# Patient Record
Sex: Female | Born: 1935 | Race: Black or African American | Hispanic: No | Smoking: Former smoker
Health system: Southern US, Community
[De-identification: ages and names within clinical notes are randomized; demographics above are authoritative.]

## PROBLEM LIST (undated history)

## (undated) DIAGNOSIS — R911 Solitary pulmonary nodule: Secondary | ICD-10-CM

## (undated) DIAGNOSIS — G4733 Obstructive sleep apnea (adult) (pediatric): Secondary | ICD-10-CM

## (undated) DIAGNOSIS — E785 Hyperlipidemia, unspecified: Secondary | ICD-10-CM

## (undated) DIAGNOSIS — J449 Chronic obstructive pulmonary disease, unspecified: Secondary | ICD-10-CM

## (undated) DIAGNOSIS — E119 Type 2 diabetes mellitus without complications: Secondary | ICD-10-CM

## (undated) DIAGNOSIS — E049 Nontoxic goiter, unspecified: Secondary | ICD-10-CM

## (undated) DIAGNOSIS — I10 Essential (primary) hypertension: Secondary | ICD-10-CM

## (undated) HISTORY — PX: ABDOMINAL HYSTERECTOMY: SHX81

## (undated) HISTORY — PX: COLON SURGERY: SHX602

---

## 2005-07-15 ENCOUNTER — Ambulatory Visit (HOSPITAL_COMMUNITY): Admission: RE | Admit: 2005-07-15 | Discharge: 2005-07-15 | Payer: Self-pay | Admitting: Specialist

## 2011-02-25 DIAGNOSIS — J449 Chronic obstructive pulmonary disease, unspecified: Secondary | ICD-10-CM | POA: Diagnosis not present

## 2011-02-25 DIAGNOSIS — J984 Other disorders of lung: Secondary | ICD-10-CM | POA: Diagnosis not present

## 2011-02-25 DIAGNOSIS — G4730 Sleep apnea, unspecified: Secondary | ICD-10-CM | POA: Diagnosis not present

## 2011-02-25 DIAGNOSIS — G4710 Hypersomnia, unspecified: Secondary | ICD-10-CM | POA: Diagnosis not present

## 2011-03-08 DIAGNOSIS — H409 Unspecified glaucoma: Secondary | ICD-10-CM | POA: Diagnosis not present

## 2011-03-08 DIAGNOSIS — H4011X0 Primary open-angle glaucoma, stage unspecified: Secondary | ICD-10-CM | POA: Diagnosis not present

## 2011-03-30 DIAGNOSIS — R0982 Postnasal drip: Secondary | ICD-10-CM | POA: Diagnosis not present

## 2011-03-30 DIAGNOSIS — G4730 Sleep apnea, unspecified: Secondary | ICD-10-CM | POA: Diagnosis not present

## 2011-03-30 DIAGNOSIS — J449 Chronic obstructive pulmonary disease, unspecified: Secondary | ICD-10-CM | POA: Diagnosis not present

## 2011-03-30 DIAGNOSIS — J984 Other disorders of lung: Secondary | ICD-10-CM | POA: Diagnosis not present

## 2011-03-31 DIAGNOSIS — J984 Other disorders of lung: Secondary | ICD-10-CM | POA: Diagnosis not present

## 2011-03-31 DIAGNOSIS — G4710 Hypersomnia, unspecified: Secondary | ICD-10-CM | POA: Diagnosis not present

## 2011-03-31 DIAGNOSIS — J449 Chronic obstructive pulmonary disease, unspecified: Secondary | ICD-10-CM | POA: Diagnosis not present

## 2011-03-31 DIAGNOSIS — R0982 Postnasal drip: Secondary | ICD-10-CM | POA: Diagnosis not present

## 2011-03-31 DIAGNOSIS — G4730 Sleep apnea, unspecified: Secondary | ICD-10-CM | POA: Diagnosis not present

## 2011-07-06 DIAGNOSIS — J449 Chronic obstructive pulmonary disease, unspecified: Secondary | ICD-10-CM | POA: Diagnosis not present

## 2011-07-06 DIAGNOSIS — G4730 Sleep apnea, unspecified: Secondary | ICD-10-CM | POA: Diagnosis not present

## 2011-07-06 DIAGNOSIS — G4710 Hypersomnia, unspecified: Secondary | ICD-10-CM | POA: Diagnosis not present

## 2011-07-06 DIAGNOSIS — R0982 Postnasal drip: Secondary | ICD-10-CM | POA: Diagnosis not present

## 2011-07-06 DIAGNOSIS — J984 Other disorders of lung: Secondary | ICD-10-CM | POA: Diagnosis not present

## 2011-08-09 DIAGNOSIS — J449 Chronic obstructive pulmonary disease, unspecified: Secondary | ICD-10-CM | POA: Diagnosis not present

## 2011-08-09 DIAGNOSIS — J984 Other disorders of lung: Secondary | ICD-10-CM | POA: Diagnosis not present

## 2011-08-09 DIAGNOSIS — G4730 Sleep apnea, unspecified: Secondary | ICD-10-CM | POA: Diagnosis not present

## 2011-08-09 DIAGNOSIS — R0982 Postnasal drip: Secondary | ICD-10-CM | POA: Diagnosis not present

## 2011-08-10 DIAGNOSIS — J984 Other disorders of lung: Secondary | ICD-10-CM | POA: Diagnosis not present

## 2011-08-10 DIAGNOSIS — J449 Chronic obstructive pulmonary disease, unspecified: Secondary | ICD-10-CM | POA: Diagnosis not present

## 2011-08-10 DIAGNOSIS — G4710 Hypersomnia, unspecified: Secondary | ICD-10-CM | POA: Diagnosis not present

## 2011-08-10 DIAGNOSIS — R0982 Postnasal drip: Secondary | ICD-10-CM | POA: Diagnosis not present

## 2011-09-13 DIAGNOSIS — H43819 Vitreous degeneration, unspecified eye: Secondary | ICD-10-CM | POA: Diagnosis not present

## 2011-09-13 DIAGNOSIS — H409 Unspecified glaucoma: Secondary | ICD-10-CM | POA: Diagnosis not present

## 2011-09-13 DIAGNOSIS — H4011X0 Primary open-angle glaucoma, stage unspecified: Secondary | ICD-10-CM | POA: Diagnosis not present

## 2011-09-13 DIAGNOSIS — H2510 Age-related nuclear cataract, unspecified eye: Secondary | ICD-10-CM | POA: Diagnosis not present

## 2011-10-05 DIAGNOSIS — R911 Solitary pulmonary nodule: Secondary | ICD-10-CM | POA: Diagnosis not present

## 2011-10-13 DIAGNOSIS — E041 Nontoxic single thyroid nodule: Secondary | ICD-10-CM | POA: Diagnosis not present

## 2011-11-17 DIAGNOSIS — G4710 Hypersomnia, unspecified: Secondary | ICD-10-CM | POA: Diagnosis not present

## 2011-11-17 DIAGNOSIS — J449 Chronic obstructive pulmonary disease, unspecified: Secondary | ICD-10-CM | POA: Diagnosis not present

## 2011-11-17 DIAGNOSIS — G4730 Sleep apnea, unspecified: Secondary | ICD-10-CM | POA: Diagnosis not present

## 2011-11-17 DIAGNOSIS — Z23 Encounter for immunization: Secondary | ICD-10-CM | POA: Diagnosis not present

## 2011-11-17 DIAGNOSIS — J984 Other disorders of lung: Secondary | ICD-10-CM | POA: Diagnosis not present

## 2011-11-18 DIAGNOSIS — G4710 Hypersomnia, unspecified: Secondary | ICD-10-CM | POA: Diagnosis not present

## 2011-11-18 DIAGNOSIS — G4730 Sleep apnea, unspecified: Secondary | ICD-10-CM | POA: Diagnosis not present

## 2011-11-23 DIAGNOSIS — E119 Type 2 diabetes mellitus without complications: Secondary | ICD-10-CM | POA: Diagnosis not present

## 2011-11-23 DIAGNOSIS — E041 Nontoxic single thyroid nodule: Secondary | ICD-10-CM | POA: Diagnosis not present

## 2011-11-23 DIAGNOSIS — R1310 Dysphagia, unspecified: Secondary | ICD-10-CM | POA: Diagnosis not present

## 2011-11-25 DIAGNOSIS — J449 Chronic obstructive pulmonary disease, unspecified: Secondary | ICD-10-CM | POA: Diagnosis not present

## 2012-01-27 DIAGNOSIS — E119 Type 2 diabetes mellitus without complications: Secondary | ICD-10-CM | POA: Diagnosis not present

## 2012-01-27 DIAGNOSIS — Z79899 Other long term (current) drug therapy: Secondary | ICD-10-CM | POA: Diagnosis not present

## 2012-01-27 DIAGNOSIS — E049 Nontoxic goiter, unspecified: Secondary | ICD-10-CM | POA: Diagnosis not present

## 2012-01-27 DIAGNOSIS — E7800 Pure hypercholesterolemia, unspecified: Secondary | ICD-10-CM | POA: Diagnosis not present

## 2012-02-03 DIAGNOSIS — Z1231 Encounter for screening mammogram for malignant neoplasm of breast: Secondary | ICD-10-CM | POA: Diagnosis not present

## 2012-03-13 DIAGNOSIS — J449 Chronic obstructive pulmonary disease, unspecified: Secondary | ICD-10-CM | POA: Diagnosis not present

## 2012-03-13 DIAGNOSIS — R0982 Postnasal drip: Secondary | ICD-10-CM | POA: Diagnosis not present

## 2012-03-13 DIAGNOSIS — G4710 Hypersomnia, unspecified: Secondary | ICD-10-CM | POA: Diagnosis not present

## 2012-03-13 DIAGNOSIS — J984 Other disorders of lung: Secondary | ICD-10-CM | POA: Diagnosis not present

## 2012-04-24 DIAGNOSIS — H4011X0 Primary open-angle glaucoma, stage unspecified: Secondary | ICD-10-CM | POA: Diagnosis not present

## 2012-04-24 DIAGNOSIS — H2510 Age-related nuclear cataract, unspecified eye: Secondary | ICD-10-CM | POA: Diagnosis not present

## 2012-04-24 DIAGNOSIS — H43819 Vitreous degeneration, unspecified eye: Secondary | ICD-10-CM | POA: Diagnosis not present

## 2012-06-06 DIAGNOSIS — R609 Edema, unspecified: Secondary | ICD-10-CM | POA: Diagnosis not present

## 2012-06-06 DIAGNOSIS — E119 Type 2 diabetes mellitus without complications: Secondary | ICD-10-CM | POA: Diagnosis not present

## 2012-06-06 DIAGNOSIS — Z79899 Other long term (current) drug therapy: Secondary | ICD-10-CM | POA: Diagnosis not present

## 2012-06-06 DIAGNOSIS — E669 Obesity, unspecified: Secondary | ICD-10-CM | POA: Diagnosis not present

## 2012-06-06 DIAGNOSIS — E7800 Pure hypercholesterolemia, unspecified: Secondary | ICD-10-CM | POA: Diagnosis not present

## 2012-06-20 DIAGNOSIS — J984 Other disorders of lung: Secondary | ICD-10-CM | POA: Diagnosis not present

## 2012-06-20 DIAGNOSIS — G4730 Sleep apnea, unspecified: Secondary | ICD-10-CM | POA: Diagnosis not present

## 2012-06-20 DIAGNOSIS — R0982 Postnasal drip: Secondary | ICD-10-CM | POA: Diagnosis not present

## 2012-06-20 DIAGNOSIS — J449 Chronic obstructive pulmonary disease, unspecified: Secondary | ICD-10-CM | POA: Diagnosis not present

## 2012-06-20 DIAGNOSIS — G4710 Hypersomnia, unspecified: Secondary | ICD-10-CM | POA: Diagnosis not present

## 2012-06-21 DIAGNOSIS — G4710 Hypersomnia, unspecified: Secondary | ICD-10-CM | POA: Diagnosis not present

## 2012-06-21 DIAGNOSIS — G4730 Sleep apnea, unspecified: Secondary | ICD-10-CM | POA: Diagnosis not present

## 2012-10-25 DIAGNOSIS — E049 Nontoxic goiter, unspecified: Secondary | ICD-10-CM | POA: Diagnosis not present

## 2012-10-25 DIAGNOSIS — J449 Chronic obstructive pulmonary disease, unspecified: Secondary | ICD-10-CM | POA: Diagnosis not present

## 2012-10-25 DIAGNOSIS — J438 Other emphysema: Secondary | ICD-10-CM | POA: Diagnosis not present

## 2012-11-09 DIAGNOSIS — R0982 Postnasal drip: Secondary | ICD-10-CM | POA: Diagnosis not present

## 2012-11-09 DIAGNOSIS — J449 Chronic obstructive pulmonary disease, unspecified: Secondary | ICD-10-CM | POA: Diagnosis not present

## 2012-11-09 DIAGNOSIS — R918 Other nonspecific abnormal finding of lung field: Secondary | ICD-10-CM | POA: Diagnosis not present

## 2012-11-09 DIAGNOSIS — G4710 Hypersomnia, unspecified: Secondary | ICD-10-CM | POA: Diagnosis not present

## 2012-11-09 DIAGNOSIS — Z23 Encounter for immunization: Secondary | ICD-10-CM | POA: Diagnosis not present

## 2012-11-10 DIAGNOSIS — G4710 Hypersomnia, unspecified: Secondary | ICD-10-CM | POA: Diagnosis not present

## 2012-12-06 DIAGNOSIS — M542 Cervicalgia: Secondary | ICD-10-CM | POA: Diagnosis not present

## 2012-12-06 DIAGNOSIS — E1159 Type 2 diabetes mellitus with other circulatory complications: Secondary | ICD-10-CM | POA: Diagnosis not present

## 2012-12-06 DIAGNOSIS — K219 Gastro-esophageal reflux disease without esophagitis: Secondary | ICD-10-CM | POA: Diagnosis not present

## 2012-12-06 DIAGNOSIS — E7800 Pure hypercholesterolemia, unspecified: Secondary | ICD-10-CM | POA: Diagnosis not present

## 2012-12-06 DIAGNOSIS — R079 Chest pain, unspecified: Secondary | ICD-10-CM | POA: Diagnosis not present

## 2012-12-06 DIAGNOSIS — E785 Hyperlipidemia, unspecified: Secondary | ICD-10-CM | POA: Diagnosis not present

## 2012-12-06 DIAGNOSIS — I10 Essential (primary) hypertension: Secondary | ICD-10-CM | POA: Diagnosis not present

## 2012-12-06 DIAGNOSIS — D72829 Elevated white blood cell count, unspecified: Secondary | ICD-10-CM | POA: Diagnosis not present

## 2012-12-06 DIAGNOSIS — R072 Precordial pain: Secondary | ICD-10-CM | POA: Diagnosis not present

## 2012-12-06 DIAGNOSIS — J449 Chronic obstructive pulmonary disease, unspecified: Secondary | ICD-10-CM | POA: Diagnosis not present

## 2012-12-06 DIAGNOSIS — Z79899 Other long term (current) drug therapy: Secondary | ICD-10-CM | POA: Diagnosis not present

## 2012-12-06 DIAGNOSIS — E119 Type 2 diabetes mellitus without complications: Secondary | ICD-10-CM | POA: Diagnosis not present

## 2012-12-06 DIAGNOSIS — J4489 Other specified chronic obstructive pulmonary disease: Secondary | ICD-10-CM | POA: Diagnosis not present

## 2012-12-07 DIAGNOSIS — R0789 Other chest pain: Secondary | ICD-10-CM | POA: Diagnosis not present

## 2012-12-07 DIAGNOSIS — E785 Hyperlipidemia, unspecified: Secondary | ICD-10-CM | POA: Diagnosis not present

## 2012-12-07 DIAGNOSIS — R079 Chest pain, unspecified: Secondary | ICD-10-CM | POA: Diagnosis not present

## 2012-12-07 DIAGNOSIS — R9431 Abnormal electrocardiogram [ECG] [EKG]: Secondary | ICD-10-CM | POA: Diagnosis not present

## 2012-12-07 DIAGNOSIS — I10 Essential (primary) hypertension: Secondary | ICD-10-CM | POA: Diagnosis not present

## 2012-12-07 DIAGNOSIS — E1159 Type 2 diabetes mellitus with other circulatory complications: Secondary | ICD-10-CM | POA: Diagnosis not present

## 2012-12-11 DIAGNOSIS — E0590 Thyrotoxicosis, unspecified without thyrotoxic crisis or storm: Secondary | ICD-10-CM | POA: Diagnosis not present

## 2012-12-11 DIAGNOSIS — I10 Essential (primary) hypertension: Secondary | ICD-10-CM | POA: Diagnosis not present

## 2012-12-11 DIAGNOSIS — R0789 Other chest pain: Secondary | ICD-10-CM | POA: Diagnosis not present

## 2013-02-07 DIAGNOSIS — G4710 Hypersomnia, unspecified: Secondary | ICD-10-CM | POA: Diagnosis not present

## 2013-02-07 DIAGNOSIS — J449 Chronic obstructive pulmonary disease, unspecified: Secondary | ICD-10-CM | POA: Diagnosis not present

## 2013-02-07 DIAGNOSIS — G4730 Sleep apnea, unspecified: Secondary | ICD-10-CM | POA: Diagnosis not present

## 2013-02-07 DIAGNOSIS — R0982 Postnasal drip: Secondary | ICD-10-CM | POA: Diagnosis not present

## 2013-02-08 DIAGNOSIS — G4710 Hypersomnia, unspecified: Secondary | ICD-10-CM | POA: Diagnosis not present

## 2013-02-08 DIAGNOSIS — G4730 Sleep apnea, unspecified: Secondary | ICD-10-CM | POA: Diagnosis not present

## 2013-03-26 DIAGNOSIS — Z1231 Encounter for screening mammogram for malignant neoplasm of breast: Secondary | ICD-10-CM | POA: Diagnosis not present

## 2013-04-24 DIAGNOSIS — H2510 Age-related nuclear cataract, unspecified eye: Secondary | ICD-10-CM | POA: Diagnosis not present

## 2013-04-24 DIAGNOSIS — H43399 Other vitreous opacities, unspecified eye: Secondary | ICD-10-CM | POA: Diagnosis not present

## 2013-04-24 DIAGNOSIS — H43819 Vitreous degeneration, unspecified eye: Secondary | ICD-10-CM | POA: Diagnosis not present

## 2013-04-24 DIAGNOSIS — H4011X0 Primary open-angle glaucoma, stage unspecified: Secondary | ICD-10-CM | POA: Diagnosis not present

## 2013-05-07 DIAGNOSIS — G4730 Sleep apnea, unspecified: Secondary | ICD-10-CM | POA: Diagnosis not present

## 2013-05-07 DIAGNOSIS — G4710 Hypersomnia, unspecified: Secondary | ICD-10-CM | POA: Diagnosis not present

## 2013-05-07 DIAGNOSIS — R0982 Postnasal drip: Secondary | ICD-10-CM | POA: Diagnosis not present

## 2013-05-07 DIAGNOSIS — J449 Chronic obstructive pulmonary disease, unspecified: Secondary | ICD-10-CM | POA: Diagnosis not present

## 2013-05-07 DIAGNOSIS — R918 Other nonspecific abnormal finding of lung field: Secondary | ICD-10-CM | POA: Diagnosis not present

## 2013-08-08 DIAGNOSIS — R918 Other nonspecific abnormal finding of lung field: Secondary | ICD-10-CM | POA: Diagnosis not present

## 2013-08-08 DIAGNOSIS — G4730 Sleep apnea, unspecified: Secondary | ICD-10-CM | POA: Diagnosis not present

## 2013-08-08 DIAGNOSIS — J449 Chronic obstructive pulmonary disease, unspecified: Secondary | ICD-10-CM | POA: Diagnosis not present

## 2013-08-08 DIAGNOSIS — R0982 Postnasal drip: Secondary | ICD-10-CM | POA: Diagnosis not present

## 2013-08-08 DIAGNOSIS — G4710 Hypersomnia, unspecified: Secondary | ICD-10-CM | POA: Diagnosis not present

## 2013-10-15 DIAGNOSIS — G4710 Hypersomnia, unspecified: Secondary | ICD-10-CM | POA: Diagnosis not present

## 2013-10-15 DIAGNOSIS — G4730 Sleep apnea, unspecified: Secondary | ICD-10-CM | POA: Diagnosis not present

## 2013-10-18 DIAGNOSIS — IMO0001 Reserved for inherently not codable concepts without codable children: Secondary | ICD-10-CM | POA: Diagnosis not present

## 2013-10-18 DIAGNOSIS — E669 Obesity, unspecified: Secondary | ICD-10-CM | POA: Diagnosis not present

## 2013-10-18 DIAGNOSIS — Z79899 Other long term (current) drug therapy: Secondary | ICD-10-CM | POA: Diagnosis not present

## 2013-10-18 DIAGNOSIS — Z23 Encounter for immunization: Secondary | ICD-10-CM | POA: Diagnosis not present

## 2013-10-18 DIAGNOSIS — E0590 Thyrotoxicosis, unspecified without thyrotoxic crisis or storm: Secondary | ICD-10-CM | POA: Diagnosis not present

## 2013-10-18 DIAGNOSIS — E7800 Pure hypercholesterolemia, unspecified: Secondary | ICD-10-CM | POA: Diagnosis not present

## 2013-10-22 DIAGNOSIS — H4011X0 Primary open-angle glaucoma, stage unspecified: Secondary | ICD-10-CM | POA: Diagnosis not present

## 2013-10-23 DIAGNOSIS — R0982 Postnasal drip: Secondary | ICD-10-CM | POA: Diagnosis not present

## 2013-10-23 DIAGNOSIS — G4710 Hypersomnia, unspecified: Secondary | ICD-10-CM | POA: Diagnosis not present

## 2013-10-23 DIAGNOSIS — J449 Chronic obstructive pulmonary disease, unspecified: Secondary | ICD-10-CM | POA: Diagnosis not present

## 2013-11-02 DIAGNOSIS — J208 Acute bronchitis due to other specified organisms: Secondary | ICD-10-CM | POA: Diagnosis not present

## 2013-11-27 DIAGNOSIS — J449 Chronic obstructive pulmonary disease, unspecified: Secondary | ICD-10-CM | POA: Diagnosis not present

## 2013-11-27 DIAGNOSIS — G4733 Obstructive sleep apnea (adult) (pediatric): Secondary | ICD-10-CM | POA: Diagnosis not present

## 2013-11-27 DIAGNOSIS — R918 Other nonspecific abnormal finding of lung field: Secondary | ICD-10-CM | POA: Diagnosis not present

## 2013-11-27 DIAGNOSIS — R0982 Postnasal drip: Secondary | ICD-10-CM | POA: Diagnosis not present

## 2013-12-25 DIAGNOSIS — M79605 Pain in left leg: Secondary | ICD-10-CM | POA: Diagnosis not present

## 2014-03-12 DIAGNOSIS — R0982 Postnasal drip: Secondary | ICD-10-CM | POA: Diagnosis not present

## 2014-03-12 DIAGNOSIS — J449 Chronic obstructive pulmonary disease, unspecified: Secondary | ICD-10-CM | POA: Diagnosis not present

## 2014-03-12 DIAGNOSIS — R0600 Dyspnea, unspecified: Secondary | ICD-10-CM | POA: Diagnosis not present

## 2014-03-12 DIAGNOSIS — G4733 Obstructive sleep apnea (adult) (pediatric): Secondary | ICD-10-CM | POA: Diagnosis not present

## 2014-04-15 DIAGNOSIS — Z1231 Encounter for screening mammogram for malignant neoplasm of breast: Secondary | ICD-10-CM | POA: Diagnosis not present

## 2014-04-23 DIAGNOSIS — H4011X1 Primary open-angle glaucoma, mild stage: Secondary | ICD-10-CM | POA: Diagnosis not present

## 2014-04-29 DIAGNOSIS — R918 Other nonspecific abnormal finding of lung field: Secondary | ICD-10-CM | POA: Diagnosis not present

## 2014-04-29 DIAGNOSIS — R0982 Postnasal drip: Secondary | ICD-10-CM | POA: Diagnosis not present

## 2014-04-29 DIAGNOSIS — G4733 Obstructive sleep apnea (adult) (pediatric): Secondary | ICD-10-CM | POA: Diagnosis not present

## 2014-04-29 DIAGNOSIS — J449 Chronic obstructive pulmonary disease, unspecified: Secondary | ICD-10-CM | POA: Diagnosis not present

## 2014-06-13 DIAGNOSIS — B309 Viral conjunctivitis, unspecified: Secondary | ICD-10-CM | POA: Diagnosis not present

## 2014-08-06 DIAGNOSIS — R0600 Dyspnea, unspecified: Secondary | ICD-10-CM | POA: Diagnosis not present

## 2014-08-06 DIAGNOSIS — R0982 Postnasal drip: Secondary | ICD-10-CM | POA: Diagnosis not present

## 2014-08-06 DIAGNOSIS — G4733 Obstructive sleep apnea (adult) (pediatric): Secondary | ICD-10-CM | POA: Diagnosis not present

## 2014-08-06 DIAGNOSIS — J449 Chronic obstructive pulmonary disease, unspecified: Secondary | ICD-10-CM | POA: Diagnosis not present

## 2014-08-13 DIAGNOSIS — H4011X1 Primary open-angle glaucoma, mild stage: Secondary | ICD-10-CM | POA: Diagnosis not present

## 2014-09-17 DIAGNOSIS — E780 Pure hypercholesterolemia: Secondary | ICD-10-CM | POA: Diagnosis not present

## 2014-09-17 DIAGNOSIS — E1165 Type 2 diabetes mellitus with hyperglycemia: Secondary | ICD-10-CM | POA: Diagnosis not present

## 2014-09-17 DIAGNOSIS — I10 Essential (primary) hypertension: Secondary | ICD-10-CM | POA: Diagnosis not present

## 2014-09-17 DIAGNOSIS — Z0000 Encounter for general adult medical examination without abnormal findings: Secondary | ICD-10-CM | POA: Diagnosis not present

## 2014-09-17 DIAGNOSIS — Z23 Encounter for immunization: Secondary | ICD-10-CM | POA: Diagnosis not present

## 2014-09-17 DIAGNOSIS — E0590 Thyrotoxicosis, unspecified without thyrotoxic crisis or storm: Secondary | ICD-10-CM | POA: Diagnosis not present

## 2014-09-17 DIAGNOSIS — F419 Anxiety disorder, unspecified: Secondary | ICD-10-CM | POA: Diagnosis not present

## 2014-09-17 DIAGNOSIS — Z79899 Other long term (current) drug therapy: Secondary | ICD-10-CM | POA: Diagnosis not present

## 2014-09-24 DIAGNOSIS — Z23 Encounter for immunization: Secondary | ICD-10-CM | POA: Diagnosis not present

## 2014-10-22 DIAGNOSIS — M25562 Pain in left knee: Secondary | ICD-10-CM | POA: Diagnosis not present

## 2014-11-07 DIAGNOSIS — J449 Chronic obstructive pulmonary disease, unspecified: Secondary | ICD-10-CM | POA: Diagnosis not present

## 2014-11-07 DIAGNOSIS — G4733 Obstructive sleep apnea (adult) (pediatric): Secondary | ICD-10-CM | POA: Diagnosis not present

## 2014-11-07 DIAGNOSIS — R0600 Dyspnea, unspecified: Secondary | ICD-10-CM | POA: Diagnosis not present

## 2014-11-07 DIAGNOSIS — R918 Other nonspecific abnormal finding of lung field: Secondary | ICD-10-CM | POA: Diagnosis not present

## 2014-12-10 DIAGNOSIS — H401131 Primary open-angle glaucoma, bilateral, mild stage: Secondary | ICD-10-CM | POA: Diagnosis not present

## 2015-01-01 DIAGNOSIS — J441 Chronic obstructive pulmonary disease with (acute) exacerbation: Secondary | ICD-10-CM | POA: Diagnosis not present

## 2015-02-14 DIAGNOSIS — G4733 Obstructive sleep apnea (adult) (pediatric): Secondary | ICD-10-CM | POA: Diagnosis not present

## 2015-03-03 DIAGNOSIS — R0982 Postnasal drip: Secondary | ICD-10-CM | POA: Diagnosis not present

## 2015-03-03 DIAGNOSIS — R0600 Dyspnea, unspecified: Secondary | ICD-10-CM | POA: Diagnosis not present

## 2015-03-03 DIAGNOSIS — G4733 Obstructive sleep apnea (adult) (pediatric): Secondary | ICD-10-CM | POA: Diagnosis not present

## 2015-03-03 DIAGNOSIS — R918 Other nonspecific abnormal finding of lung field: Secondary | ICD-10-CM | POA: Diagnosis not present

## 2015-03-03 DIAGNOSIS — J449 Chronic obstructive pulmonary disease, unspecified: Secondary | ICD-10-CM | POA: Diagnosis not present

## 2015-04-03 DIAGNOSIS — H401131 Primary open-angle glaucoma, bilateral, mild stage: Secondary | ICD-10-CM | POA: Diagnosis not present

## 2015-05-02 DIAGNOSIS — I10 Essential (primary) hypertension: Secondary | ICD-10-CM | POA: Diagnosis not present

## 2015-05-02 DIAGNOSIS — J449 Chronic obstructive pulmonary disease, unspecified: Secondary | ICD-10-CM | POA: Diagnosis not present

## 2015-05-02 DIAGNOSIS — E119 Type 2 diabetes mellitus without complications: Secondary | ICD-10-CM | POA: Diagnosis not present

## 2015-05-02 DIAGNOSIS — K219 Gastro-esophageal reflux disease without esophagitis: Secondary | ICD-10-CM | POA: Diagnosis not present

## 2015-05-02 DIAGNOSIS — E0590 Thyrotoxicosis, unspecified without thyrotoxic crisis or storm: Secondary | ICD-10-CM | POA: Diagnosis not present

## 2015-05-06 DIAGNOSIS — Z1231 Encounter for screening mammogram for malignant neoplasm of breast: Secondary | ICD-10-CM | POA: Diagnosis not present

## 2015-06-18 DIAGNOSIS — J189 Pneumonia, unspecified organism: Secondary | ICD-10-CM | POA: Diagnosis not present

## 2015-06-27 DIAGNOSIS — Z9981 Dependence on supplemental oxygen: Secondary | ICD-10-CM | POA: Diagnosis not present

## 2015-06-27 DIAGNOSIS — J9611 Chronic respiratory failure with hypoxia: Secondary | ICD-10-CM | POA: Diagnosis not present

## 2015-06-27 DIAGNOSIS — J449 Chronic obstructive pulmonary disease, unspecified: Secondary | ICD-10-CM | POA: Diagnosis not present

## 2015-06-27 DIAGNOSIS — J189 Pneumonia, unspecified organism: Secondary | ICD-10-CM | POA: Diagnosis not present

## 2015-07-02 DIAGNOSIS — H401131 Primary open-angle glaucoma, bilateral, mild stage: Secondary | ICD-10-CM | POA: Diagnosis not present

## 2015-07-18 DIAGNOSIS — J449 Chronic obstructive pulmonary disease, unspecified: Secondary | ICD-10-CM | POA: Diagnosis not present

## 2015-07-22 DIAGNOSIS — G4733 Obstructive sleep apnea (adult) (pediatric): Secondary | ICD-10-CM | POA: Diagnosis not present

## 2015-08-05 DIAGNOSIS — H401131 Primary open-angle glaucoma, bilateral, mild stage: Secondary | ICD-10-CM | POA: Diagnosis not present

## 2015-09-23 ENCOUNTER — Other Ambulatory Visit: Payer: Self-pay

## 2015-10-23 DIAGNOSIS — E785 Hyperlipidemia, unspecified: Secondary | ICD-10-CM | POA: Diagnosis not present

## 2015-10-23 DIAGNOSIS — E0590 Thyrotoxicosis, unspecified without thyrotoxic crisis or storm: Secondary | ICD-10-CM | POA: Diagnosis not present

## 2015-10-23 DIAGNOSIS — I10 Essential (primary) hypertension: Secondary | ICD-10-CM | POA: Diagnosis not present

## 2015-10-23 DIAGNOSIS — Z79899 Other long term (current) drug therapy: Secondary | ICD-10-CM | POA: Diagnosis not present

## 2015-10-23 DIAGNOSIS — Z0000 Encounter for general adult medical examination without abnormal findings: Secondary | ICD-10-CM | POA: Diagnosis not present

## 2015-10-23 DIAGNOSIS — E119 Type 2 diabetes mellitus without complications: Secondary | ICD-10-CM | POA: Diagnosis not present

## 2015-10-23 DIAGNOSIS — Z23 Encounter for immunization: Secondary | ICD-10-CM | POA: Diagnosis not present

## 2015-10-24 DIAGNOSIS — R0600 Dyspnea, unspecified: Secondary | ICD-10-CM | POA: Diagnosis not present

## 2015-10-24 DIAGNOSIS — R0982 Postnasal drip: Secondary | ICD-10-CM | POA: Diagnosis not present

## 2015-10-24 DIAGNOSIS — J449 Chronic obstructive pulmonary disease, unspecified: Secondary | ICD-10-CM | POA: Diagnosis not present

## 2015-10-24 DIAGNOSIS — R911 Solitary pulmonary nodule: Secondary | ICD-10-CM | POA: Diagnosis not present

## 2015-10-24 DIAGNOSIS — G4733 Obstructive sleep apnea (adult) (pediatric): Secondary | ICD-10-CM | POA: Diagnosis not present

## 2015-10-27 DIAGNOSIS — G4733 Obstructive sleep apnea (adult) (pediatric): Secondary | ICD-10-CM | POA: Diagnosis not present

## 2015-11-06 DIAGNOSIS — H401131 Primary open-angle glaucoma, bilateral, mild stage: Secondary | ICD-10-CM | POA: Diagnosis not present

## 2016-01-13 DIAGNOSIS — R918 Other nonspecific abnormal finding of lung field: Secondary | ICD-10-CM | POA: Diagnosis not present

## 2016-01-13 DIAGNOSIS — R0600 Dyspnea, unspecified: Secondary | ICD-10-CM | POA: Diagnosis not present

## 2016-01-13 DIAGNOSIS — R0982 Postnasal drip: Secondary | ICD-10-CM | POA: Diagnosis not present

## 2016-01-13 DIAGNOSIS — G4733 Obstructive sleep apnea (adult) (pediatric): Secondary | ICD-10-CM | POA: Diagnosis not present

## 2016-01-14 DIAGNOSIS — G4733 Obstructive sleep apnea (adult) (pediatric): Secondary | ICD-10-CM | POA: Diagnosis not present

## 2016-01-15 DIAGNOSIS — Z1389 Encounter for screening for other disorder: Secondary | ICD-10-CM | POA: Diagnosis not present

## 2016-01-15 DIAGNOSIS — Z9181 History of falling: Secondary | ICD-10-CM | POA: Diagnosis not present

## 2016-01-15 DIAGNOSIS — N3001 Acute cystitis with hematuria: Secondary | ICD-10-CM | POA: Diagnosis not present

## 2016-01-15 DIAGNOSIS — I10 Essential (primary) hypertension: Secondary | ICD-10-CM | POA: Diagnosis not present

## 2016-01-21 DIAGNOSIS — N390 Urinary tract infection, site not specified: Secondary | ICD-10-CM | POA: Diagnosis not present

## 2016-01-21 DIAGNOSIS — N3001 Acute cystitis with hematuria: Secondary | ICD-10-CM | POA: Diagnosis not present

## 2016-02-05 DIAGNOSIS — H401131 Primary open-angle glaucoma, bilateral, mild stage: Secondary | ICD-10-CM | POA: Diagnosis not present

## 2016-03-01 DIAGNOSIS — Z9981 Dependence on supplemental oxygen: Secondary | ICD-10-CM | POA: Diagnosis not present

## 2016-03-01 DIAGNOSIS — J9611 Chronic respiratory failure with hypoxia: Secondary | ICD-10-CM | POA: Diagnosis not present

## 2016-03-01 DIAGNOSIS — J441 Chronic obstructive pulmonary disease with (acute) exacerbation: Secondary | ICD-10-CM | POA: Diagnosis not present

## 2016-04-14 DIAGNOSIS — J449 Chronic obstructive pulmonary disease, unspecified: Secondary | ICD-10-CM | POA: Diagnosis not present

## 2016-04-14 DIAGNOSIS — J301 Allergic rhinitis due to pollen: Secondary | ICD-10-CM | POA: Diagnosis not present

## 2016-04-14 DIAGNOSIS — G4733 Obstructive sleep apnea (adult) (pediatric): Secondary | ICD-10-CM | POA: Diagnosis not present

## 2016-04-15 DIAGNOSIS — J449 Chronic obstructive pulmonary disease, unspecified: Secondary | ICD-10-CM | POA: Diagnosis not present

## 2016-04-16 DIAGNOSIS — G4733 Obstructive sleep apnea (adult) (pediatric): Secondary | ICD-10-CM | POA: Diagnosis not present

## 2016-05-12 DIAGNOSIS — S59912A Unspecified injury of left forearm, initial encounter: Secondary | ICD-10-CM | POA: Diagnosis not present

## 2016-05-12 DIAGNOSIS — S63501A Unspecified sprain of right wrist, initial encounter: Secondary | ICD-10-CM | POA: Diagnosis not present

## 2016-05-13 DIAGNOSIS — H401131 Primary open-angle glaucoma, bilateral, mild stage: Secondary | ICD-10-CM | POA: Diagnosis not present

## 2016-06-29 DIAGNOSIS — R0982 Postnasal drip: Secondary | ICD-10-CM | POA: Diagnosis not present

## 2016-06-29 DIAGNOSIS — J449 Chronic obstructive pulmonary disease, unspecified: Secondary | ICD-10-CM | POA: Diagnosis not present

## 2016-06-29 DIAGNOSIS — R918 Other nonspecific abnormal finding of lung field: Secondary | ICD-10-CM | POA: Diagnosis not present

## 2016-06-29 DIAGNOSIS — G4733 Obstructive sleep apnea (adult) (pediatric): Secondary | ICD-10-CM | POA: Diagnosis not present

## 2016-06-30 DIAGNOSIS — G4733 Obstructive sleep apnea (adult) (pediatric): Secondary | ICD-10-CM | POA: Diagnosis not present

## 2016-08-31 DIAGNOSIS — I10 Essential (primary) hypertension: Secondary | ICD-10-CM | POA: Diagnosis not present

## 2016-08-31 DIAGNOSIS — E785 Hyperlipidemia, unspecified: Secondary | ICD-10-CM | POA: Diagnosis not present

## 2016-08-31 DIAGNOSIS — Z79899 Other long term (current) drug therapy: Secondary | ICD-10-CM | POA: Diagnosis not present

## 2016-08-31 DIAGNOSIS — E119 Type 2 diabetes mellitus without complications: Secondary | ICD-10-CM | POA: Diagnosis not present

## 2016-08-31 DIAGNOSIS — E040 Nontoxic diffuse goiter: Secondary | ICD-10-CM | POA: Diagnosis not present

## 2016-08-31 DIAGNOSIS — M25551 Pain in right hip: Secondary | ICD-10-CM | POA: Diagnosis not present

## 2016-08-31 DIAGNOSIS — K219 Gastro-esophageal reflux disease without esophagitis: Secondary | ICD-10-CM | POA: Diagnosis not present

## 2016-09-02 DIAGNOSIS — Z1231 Encounter for screening mammogram for malignant neoplasm of breast: Secondary | ICD-10-CM | POA: Diagnosis not present

## 2016-09-23 DIAGNOSIS — H401131 Primary open-angle glaucoma, bilateral, mild stage: Secondary | ICD-10-CM | POA: Diagnosis not present

## 2016-10-01 DIAGNOSIS — J449 Chronic obstructive pulmonary disease, unspecified: Secondary | ICD-10-CM | POA: Diagnosis not present

## 2016-10-01 DIAGNOSIS — R0982 Postnasal drip: Secondary | ICD-10-CM | POA: Diagnosis not present

## 2016-10-01 DIAGNOSIS — R0600 Dyspnea, unspecified: Secondary | ICD-10-CM | POA: Diagnosis not present

## 2016-12-24 DIAGNOSIS — H401131 Primary open-angle glaucoma, bilateral, mild stage: Secondary | ICD-10-CM | POA: Diagnosis not present

## 2017-01-04 DIAGNOSIS — S6991XA Unspecified injury of right wrist, hand and finger(s), initial encounter: Secondary | ICD-10-CM | POA: Diagnosis not present

## 2017-01-04 DIAGNOSIS — Z79899 Other long term (current) drug therapy: Secondary | ICD-10-CM | POA: Diagnosis not present

## 2017-01-04 DIAGNOSIS — R1011 Right upper quadrant pain: Secondary | ICD-10-CM | POA: Diagnosis not present

## 2017-01-04 DIAGNOSIS — Z7984 Long term (current) use of oral hypoglycemic drugs: Secondary | ICD-10-CM | POA: Diagnosis not present

## 2017-01-04 DIAGNOSIS — S52124A Nondisplaced fracture of head of right radius, initial encounter for closed fracture: Secondary | ICD-10-CM | POA: Diagnosis not present

## 2017-01-04 DIAGNOSIS — R109 Unspecified abdominal pain: Secondary | ICD-10-CM | POA: Diagnosis not present

## 2017-01-04 DIAGNOSIS — E119 Type 2 diabetes mellitus without complications: Secondary | ICD-10-CM | POA: Diagnosis not present

## 2017-01-04 DIAGNOSIS — I10 Essential (primary) hypertension: Secondary | ICD-10-CM | POA: Diagnosis not present

## 2017-01-04 DIAGNOSIS — E7800 Pure hypercholesterolemia, unspecified: Secondary | ICD-10-CM | POA: Diagnosis not present

## 2017-01-04 DIAGNOSIS — S3991XA Unspecified injury of abdomen, initial encounter: Secondary | ICD-10-CM | POA: Diagnosis not present

## 2017-01-04 DIAGNOSIS — M25531 Pain in right wrist: Secondary | ICD-10-CM | POA: Diagnosis not present

## 2017-01-04 DIAGNOSIS — J449 Chronic obstructive pulmonary disease, unspecified: Secondary | ICD-10-CM | POA: Diagnosis not present

## 2017-01-04 DIAGNOSIS — S52351A Displaced comminuted fracture of shaft of radius, right arm, initial encounter for closed fracture: Secondary | ICD-10-CM | POA: Diagnosis not present

## 2017-01-06 DIAGNOSIS — S52121A Displaced fracture of head of right radius, initial encounter for closed fracture: Secondary | ICD-10-CM | POA: Diagnosis not present

## 2017-01-13 DIAGNOSIS — S52121A Displaced fracture of head of right radius, initial encounter for closed fracture: Secondary | ICD-10-CM | POA: Diagnosis not present

## 2017-01-14 DIAGNOSIS — Z23 Encounter for immunization: Secondary | ICD-10-CM | POA: Diagnosis not present

## 2017-01-14 DIAGNOSIS — S52121D Displaced fracture of head of right radius, subsequent encounter for closed fracture with routine healing: Secondary | ICD-10-CM | POA: Diagnosis not present

## 2017-01-14 DIAGNOSIS — Z6827 Body mass index (BMI) 27.0-27.9, adult: Secondary | ICD-10-CM | POA: Diagnosis not present

## 2017-01-21 DIAGNOSIS — R0600 Dyspnea, unspecified: Secondary | ICD-10-CM | POA: Diagnosis not present

## 2017-01-21 DIAGNOSIS — R0982 Postnasal drip: Secondary | ICD-10-CM | POA: Diagnosis not present

## 2017-01-21 DIAGNOSIS — J449 Chronic obstructive pulmonary disease, unspecified: Secondary | ICD-10-CM | POA: Diagnosis not present

## 2017-01-24 DIAGNOSIS — G4733 Obstructive sleep apnea (adult) (pediatric): Secondary | ICD-10-CM | POA: Diagnosis not present

## 2017-01-26 DIAGNOSIS — S52121A Displaced fracture of head of right radius, initial encounter for closed fracture: Secondary | ICD-10-CM | POA: Diagnosis not present

## 2017-02-16 DIAGNOSIS — S52121A Displaced fracture of head of right radius, initial encounter for closed fracture: Secondary | ICD-10-CM | POA: Diagnosis not present

## 2017-03-26 DIAGNOSIS — J441 Chronic obstructive pulmonary disease with (acute) exacerbation: Secondary | ICD-10-CM | POA: Diagnosis not present

## 2017-03-26 DIAGNOSIS — J440 Chronic obstructive pulmonary disease with acute lower respiratory infection: Secondary | ICD-10-CM | POA: Diagnosis not present

## 2017-03-26 DIAGNOSIS — Z881 Allergy status to other antibiotic agents status: Secondary | ICD-10-CM | POA: Diagnosis not present

## 2017-03-26 DIAGNOSIS — R0602 Shortness of breath: Secondary | ICD-10-CM | POA: Diagnosis not present

## 2017-03-26 DIAGNOSIS — R0682 Tachypnea, not elsewhere classified: Secondary | ICD-10-CM | POA: Diagnosis not present

## 2017-03-30 DIAGNOSIS — S52121A Displaced fracture of head of right radius, initial encounter for closed fracture: Secondary | ICD-10-CM | POA: Diagnosis not present

## 2017-04-15 DIAGNOSIS — H401131 Primary open-angle glaucoma, bilateral, mild stage: Secondary | ICD-10-CM | POA: Diagnosis not present

## 2017-04-20 DIAGNOSIS — R0600 Dyspnea, unspecified: Secondary | ICD-10-CM | POA: Diagnosis not present

## 2017-04-20 DIAGNOSIS — J449 Chronic obstructive pulmonary disease, unspecified: Secondary | ICD-10-CM | POA: Diagnosis not present

## 2017-04-20 DIAGNOSIS — R0982 Postnasal drip: Secondary | ICD-10-CM | POA: Diagnosis not present

## 2017-06-14 DIAGNOSIS — Z6827 Body mass index (BMI) 27.0-27.9, adult: Secondary | ICD-10-CM | POA: Diagnosis not present

## 2017-06-14 DIAGNOSIS — L729 Follicular cyst of the skin and subcutaneous tissue, unspecified: Secondary | ICD-10-CM | POA: Diagnosis not present

## 2017-07-13 DIAGNOSIS — J449 Chronic obstructive pulmonary disease, unspecified: Secondary | ICD-10-CM | POA: Diagnosis not present

## 2017-07-13 DIAGNOSIS — E785 Hyperlipidemia, unspecified: Secondary | ICD-10-CM | POA: Diagnosis not present

## 2017-07-13 DIAGNOSIS — E1169 Type 2 diabetes mellitus with other specified complication: Secondary | ICD-10-CM | POA: Diagnosis not present

## 2017-07-13 DIAGNOSIS — Z0000 Encounter for general adult medical examination without abnormal findings: Secondary | ICD-10-CM | POA: Diagnosis not present

## 2017-07-13 DIAGNOSIS — J9611 Chronic respiratory failure with hypoxia: Secondary | ICD-10-CM | POA: Diagnosis not present

## 2017-07-13 DIAGNOSIS — E040 Nontoxic diffuse goiter: Secondary | ICD-10-CM | POA: Diagnosis not present

## 2017-07-19 DIAGNOSIS — G4733 Obstructive sleep apnea (adult) (pediatric): Secondary | ICD-10-CM | POA: Diagnosis not present

## 2017-07-19 DIAGNOSIS — R0982 Postnasal drip: Secondary | ICD-10-CM | POA: Diagnosis not present

## 2017-07-19 DIAGNOSIS — J449 Chronic obstructive pulmonary disease, unspecified: Secondary | ICD-10-CM | POA: Diagnosis not present

## 2017-08-01 DIAGNOSIS — H524 Presbyopia: Secondary | ICD-10-CM | POA: Diagnosis not present

## 2017-08-01 DIAGNOSIS — H401131 Primary open-angle glaucoma, bilateral, mild stage: Secondary | ICD-10-CM | POA: Diagnosis not present

## 2017-11-01 DIAGNOSIS — H401132 Primary open-angle glaucoma, bilateral, moderate stage: Secondary | ICD-10-CM | POA: Diagnosis not present

## 2017-11-22 DIAGNOSIS — R0982 Postnasal drip: Secondary | ICD-10-CM | POA: Diagnosis not present

## 2017-11-22 DIAGNOSIS — J449 Chronic obstructive pulmonary disease, unspecified: Secondary | ICD-10-CM | POA: Diagnosis not present

## 2017-11-22 DIAGNOSIS — G4733 Obstructive sleep apnea (adult) (pediatric): Secondary | ICD-10-CM | POA: Diagnosis not present

## 2017-11-25 DIAGNOSIS — Z1231 Encounter for screening mammogram for malignant neoplasm of breast: Secondary | ICD-10-CM | POA: Diagnosis not present

## 2017-12-09 ENCOUNTER — Other Ambulatory Visit: Payer: Self-pay

## 2017-12-12 DIAGNOSIS — Z23 Encounter for immunization: Secondary | ICD-10-CM | POA: Diagnosis not present

## 2017-12-27 DIAGNOSIS — H25811 Combined forms of age-related cataract, right eye: Secondary | ICD-10-CM | POA: Diagnosis not present

## 2017-12-27 DIAGNOSIS — Z01818 Encounter for other preprocedural examination: Secondary | ICD-10-CM | POA: Diagnosis not present

## 2017-12-27 DIAGNOSIS — H401112 Primary open-angle glaucoma, right eye, moderate stage: Secondary | ICD-10-CM | POA: Diagnosis not present

## 2018-01-13 DIAGNOSIS — J9610 Chronic respiratory failure, unspecified whether with hypoxia or hypercapnia: Secondary | ICD-10-CM | POA: Diagnosis not present

## 2018-01-13 DIAGNOSIS — J441 Chronic obstructive pulmonary disease with (acute) exacerbation: Secondary | ICD-10-CM | POA: Diagnosis not present

## 2018-02-21 DIAGNOSIS — E7800 Pure hypercholesterolemia, unspecified: Secondary | ICD-10-CM | POA: Diagnosis not present

## 2018-02-21 DIAGNOSIS — Z7984 Long term (current) use of oral hypoglycemic drugs: Secondary | ICD-10-CM | POA: Diagnosis not present

## 2018-02-21 DIAGNOSIS — K219 Gastro-esophageal reflux disease without esophagitis: Secondary | ICD-10-CM | POA: Diagnosis not present

## 2018-02-21 DIAGNOSIS — H52223 Regular astigmatism, bilateral: Secondary | ICD-10-CM | POA: Diagnosis not present

## 2018-02-21 DIAGNOSIS — E1139 Type 2 diabetes mellitus with other diabetic ophthalmic complication: Secondary | ICD-10-CM | POA: Diagnosis not present

## 2018-02-21 DIAGNOSIS — I10 Essential (primary) hypertension: Secondary | ICD-10-CM | POA: Diagnosis not present

## 2018-02-21 DIAGNOSIS — G4730 Sleep apnea, unspecified: Secondary | ICD-10-CM | POA: Diagnosis not present

## 2018-02-21 DIAGNOSIS — Z9981 Dependence on supplemental oxygen: Secondary | ICD-10-CM | POA: Diagnosis not present

## 2018-02-21 DIAGNOSIS — Z79899 Other long term (current) drug therapy: Secondary | ICD-10-CM | POA: Diagnosis not present

## 2018-02-21 DIAGNOSIS — J449 Chronic obstructive pulmonary disease, unspecified: Secondary | ICD-10-CM | POA: Diagnosis not present

## 2018-02-21 DIAGNOSIS — E1136 Type 2 diabetes mellitus with diabetic cataract: Secondary | ICD-10-CM | POA: Diagnosis not present

## 2018-02-21 DIAGNOSIS — E079 Disorder of thyroid, unspecified: Secondary | ICD-10-CM | POA: Diagnosis not present

## 2018-02-21 DIAGNOSIS — H401110 Primary open-angle glaucoma, right eye, stage unspecified: Secondary | ICD-10-CM | POA: Diagnosis not present

## 2018-02-21 DIAGNOSIS — H401112 Primary open-angle glaucoma, right eye, moderate stage: Secondary | ICD-10-CM | POA: Diagnosis not present

## 2018-02-21 DIAGNOSIS — H25811 Combined forms of age-related cataract, right eye: Secondary | ICD-10-CM | POA: Diagnosis not present

## 2018-02-21 DIAGNOSIS — H2511 Age-related nuclear cataract, right eye: Secondary | ICD-10-CM | POA: Diagnosis not present

## 2018-02-28 DIAGNOSIS — J301 Allergic rhinitis due to pollen: Secondary | ICD-10-CM | POA: Diagnosis not present

## 2018-02-28 DIAGNOSIS — G4733 Obstructive sleep apnea (adult) (pediatric): Secondary | ICD-10-CM | POA: Diagnosis not present

## 2018-02-28 DIAGNOSIS — J449 Chronic obstructive pulmonary disease, unspecified: Secondary | ICD-10-CM | POA: Diagnosis not present

## 2018-04-07 DIAGNOSIS — E0590 Thyrotoxicosis, unspecified without thyrotoxic crisis or storm: Secondary | ICD-10-CM | POA: Diagnosis not present

## 2018-04-07 DIAGNOSIS — E1169 Type 2 diabetes mellitus with other specified complication: Secondary | ICD-10-CM | POA: Diagnosis not present

## 2018-04-07 DIAGNOSIS — E785 Hyperlipidemia, unspecified: Secondary | ICD-10-CM | POA: Diagnosis not present

## 2018-04-07 DIAGNOSIS — I10 Essential (primary) hypertension: Secondary | ICD-10-CM | POA: Diagnosis not present

## 2018-10-06 DIAGNOSIS — J449 Chronic obstructive pulmonary disease, unspecified: Secondary | ICD-10-CM | POA: Diagnosis not present

## 2018-10-06 DIAGNOSIS — G4733 Obstructive sleep apnea (adult) (pediatric): Secondary | ICD-10-CM | POA: Diagnosis not present

## 2018-10-06 DIAGNOSIS — J301 Allergic rhinitis due to pollen: Secondary | ICD-10-CM | POA: Diagnosis not present

## 2018-10-25 DIAGNOSIS — J449 Chronic obstructive pulmonary disease, unspecified: Secondary | ICD-10-CM | POA: Diagnosis not present

## 2018-10-25 DIAGNOSIS — G4733 Obstructive sleep apnea (adult) (pediatric): Secondary | ICD-10-CM | POA: Diagnosis not present

## 2018-11-03 DIAGNOSIS — H25812 Combined forms of age-related cataract, left eye: Secondary | ICD-10-CM | POA: Diagnosis not present

## 2018-11-03 DIAGNOSIS — Z01818 Encounter for other preprocedural examination: Secondary | ICD-10-CM | POA: Diagnosis not present

## 2018-11-14 DIAGNOSIS — Z79899 Other long term (current) drug therapy: Secondary | ICD-10-CM | POA: Diagnosis not present

## 2018-11-14 DIAGNOSIS — Z0000 Encounter for general adult medical examination without abnormal findings: Secondary | ICD-10-CM | POA: Diagnosis not present

## 2018-11-14 DIAGNOSIS — F419 Anxiety disorder, unspecified: Secondary | ICD-10-CM | POA: Diagnosis not present

## 2018-11-14 DIAGNOSIS — Z23 Encounter for immunization: Secondary | ICD-10-CM | POA: Diagnosis not present

## 2018-11-14 DIAGNOSIS — K219 Gastro-esophageal reflux disease without esophagitis: Secondary | ICD-10-CM | POA: Diagnosis not present

## 2018-11-14 DIAGNOSIS — E785 Hyperlipidemia, unspecified: Secondary | ICD-10-CM | POA: Diagnosis not present

## 2018-11-14 DIAGNOSIS — J449 Chronic obstructive pulmonary disease, unspecified: Secondary | ICD-10-CM | POA: Diagnosis not present

## 2018-11-14 DIAGNOSIS — D649 Anemia, unspecified: Secondary | ICD-10-CM | POA: Diagnosis not present

## 2018-11-14 DIAGNOSIS — E0590 Thyrotoxicosis, unspecified without thyrotoxic crisis or storm: Secondary | ICD-10-CM | POA: Diagnosis not present

## 2018-11-21 DIAGNOSIS — K219 Gastro-esophageal reflux disease without esophagitis: Secondary | ICD-10-CM | POA: Diagnosis not present

## 2018-11-21 DIAGNOSIS — H2512 Age-related nuclear cataract, left eye: Secondary | ICD-10-CM | POA: Diagnosis not present

## 2018-11-21 DIAGNOSIS — H401122 Primary open-angle glaucoma, left eye, moderate stage: Secondary | ICD-10-CM | POA: Diagnosis not present

## 2018-11-21 DIAGNOSIS — G4733 Obstructive sleep apnea (adult) (pediatric): Secondary | ICD-10-CM | POA: Diagnosis not present

## 2018-11-21 DIAGNOSIS — H25812 Combined forms of age-related cataract, left eye: Secondary | ICD-10-CM | POA: Diagnosis not present

## 2018-11-21 DIAGNOSIS — Z87891 Personal history of nicotine dependence: Secondary | ICD-10-CM | POA: Diagnosis not present

## 2018-11-21 DIAGNOSIS — Z9981 Dependence on supplemental oxygen: Secondary | ICD-10-CM | POA: Diagnosis not present

## 2018-11-21 DIAGNOSIS — E1136 Type 2 diabetes mellitus with diabetic cataract: Secondary | ICD-10-CM | POA: Diagnosis not present

## 2018-11-21 DIAGNOSIS — H401120 Primary open-angle glaucoma, left eye, stage unspecified: Secondary | ICD-10-CM | POA: Diagnosis not present

## 2018-11-21 DIAGNOSIS — E785 Hyperlipidemia, unspecified: Secondary | ICD-10-CM | POA: Diagnosis not present

## 2018-11-21 DIAGNOSIS — J449 Chronic obstructive pulmonary disease, unspecified: Secondary | ICD-10-CM | POA: Diagnosis not present

## 2018-11-21 DIAGNOSIS — I10 Essential (primary) hypertension: Secondary | ICD-10-CM | POA: Diagnosis not present

## 2018-12-08 DIAGNOSIS — B372 Candidiasis of skin and nail: Secondary | ICD-10-CM | POA: Diagnosis not present

## 2018-12-08 DIAGNOSIS — E785 Hyperlipidemia, unspecified: Secondary | ICD-10-CM | POA: Diagnosis not present

## 2018-12-08 DIAGNOSIS — J449 Chronic obstructive pulmonary disease, unspecified: Secondary | ICD-10-CM | POA: Diagnosis not present

## 2018-12-08 DIAGNOSIS — E1169 Type 2 diabetes mellitus with other specified complication: Secondary | ICD-10-CM | POA: Diagnosis not present

## 2019-04-10 DIAGNOSIS — J449 Chronic obstructive pulmonary disease, unspecified: Secondary | ICD-10-CM | POA: Diagnosis not present

## 2019-04-10 DIAGNOSIS — J301 Allergic rhinitis due to pollen: Secondary | ICD-10-CM | POA: Diagnosis not present

## 2019-04-10 DIAGNOSIS — G4733 Obstructive sleep apnea (adult) (pediatric): Secondary | ICD-10-CM | POA: Diagnosis not present

## 2019-05-01 DIAGNOSIS — E785 Hyperlipidemia, unspecified: Secondary | ICD-10-CM | POA: Diagnosis not present

## 2019-05-01 DIAGNOSIS — E1169 Type 2 diabetes mellitus with other specified complication: Secondary | ICD-10-CM | POA: Diagnosis not present

## 2019-05-01 DIAGNOSIS — D509 Iron deficiency anemia, unspecified: Secondary | ICD-10-CM | POA: Diagnosis not present

## 2019-05-01 DIAGNOSIS — D649 Anemia, unspecified: Secondary | ICD-10-CM | POA: Diagnosis not present

## 2019-05-01 DIAGNOSIS — I119 Hypertensive heart disease without heart failure: Secondary | ICD-10-CM | POA: Diagnosis not present

## 2019-05-01 DIAGNOSIS — I10 Essential (primary) hypertension: Secondary | ICD-10-CM | POA: Diagnosis not present

## 2019-05-01 DIAGNOSIS — E0590 Thyrotoxicosis, unspecified without thyrotoxic crisis or storm: Secondary | ICD-10-CM | POA: Diagnosis not present

## 2019-05-01 DIAGNOSIS — D6489 Other specified anemias: Secondary | ICD-10-CM | POA: Diagnosis not present

## 2019-05-01 DIAGNOSIS — Z79899 Other long term (current) drug therapy: Secondary | ICD-10-CM | POA: Diagnosis not present

## 2019-05-05 ENCOUNTER — Emergency Department (HOSPITAL_COMMUNITY): Payer: Medicare Other

## 2019-05-05 ENCOUNTER — Inpatient Hospital Stay (HOSPITAL_COMMUNITY)
Admission: EM | Admit: 2019-05-05 | Discharge: 2019-05-08 | DRG: 191 | Disposition: A | Discharge status: Home / Self Care | Payer: Medicare Other | Attending: Internal Medicine | Admitting: Internal Medicine

## 2019-05-05 DIAGNOSIS — Z66 Do not resuscitate: Secondary | ICD-10-CM | POA: Diagnosis not present

## 2019-05-05 DIAGNOSIS — J9611 Chronic respiratory failure with hypoxia: Secondary | ICD-10-CM | POA: Diagnosis present

## 2019-05-05 DIAGNOSIS — Z885 Allergy status to narcotic agent status: Secondary | ICD-10-CM

## 2019-05-05 DIAGNOSIS — Z8249 Family history of ischemic heart disease and other diseases of the circulatory system: Secondary | ICD-10-CM

## 2019-05-05 DIAGNOSIS — I130 Hypertensive heart and chronic kidney disease with heart failure and stage 1 through stage 4 chronic kidney disease, or unspecified chronic kidney disease: Secondary | ICD-10-CM | POA: Diagnosis present

## 2019-05-05 DIAGNOSIS — J449 Chronic obstructive pulmonary disease, unspecified: Secondary | ICD-10-CM | POA: Diagnosis present

## 2019-05-05 DIAGNOSIS — E1122 Type 2 diabetes mellitus with diabetic chronic kidney disease: Secondary | ICD-10-CM | POA: Diagnosis present

## 2019-05-05 DIAGNOSIS — E119 Type 2 diabetes mellitus without complications: Secondary | ICD-10-CM

## 2019-05-05 DIAGNOSIS — R911 Solitary pulmonary nodule: Secondary | ICD-10-CM | POA: Diagnosis present

## 2019-05-05 DIAGNOSIS — N1831 Chronic kidney disease, stage 3a: Secondary | ICD-10-CM | POA: Diagnosis present

## 2019-05-05 DIAGNOSIS — I5032 Chronic diastolic (congestive) heart failure: Secondary | ICD-10-CM | POA: Diagnosis not present

## 2019-05-05 DIAGNOSIS — Z87891 Personal history of nicotine dependence: Secondary | ICD-10-CM

## 2019-05-05 DIAGNOSIS — Z20822 Contact with and (suspected) exposure to covid-19: Secondary | ICD-10-CM | POA: Diagnosis present

## 2019-05-05 DIAGNOSIS — E049 Nontoxic goiter, unspecified: Secondary | ICD-10-CM | POA: Diagnosis present

## 2019-05-05 DIAGNOSIS — J441 Chronic obstructive pulmonary disease with (acute) exacerbation: Principal | ICD-10-CM

## 2019-05-05 DIAGNOSIS — Z79899 Other long term (current) drug therapy: Secondary | ICD-10-CM

## 2019-05-05 DIAGNOSIS — R0602 Shortness of breath: Secondary | ICD-10-CM | POA: Diagnosis not present

## 2019-05-05 DIAGNOSIS — G4733 Obstructive sleep apnea (adult) (pediatric): Secondary | ICD-10-CM | POA: Diagnosis present

## 2019-05-05 DIAGNOSIS — Z888 Allergy status to other drugs, medicaments and biological substances status: Secondary | ICD-10-CM

## 2019-05-05 DIAGNOSIS — Z791 Long term (current) use of non-steroidal anti-inflammatories (NSAID): Secondary | ICD-10-CM

## 2019-05-05 DIAGNOSIS — R55 Syncope and collapse: Secondary | ICD-10-CM | POA: Diagnosis not present

## 2019-05-05 DIAGNOSIS — Z7984 Long term (current) use of oral hypoglycemic drugs: Secondary | ICD-10-CM

## 2019-05-05 DIAGNOSIS — M7989 Other specified soft tissue disorders: Secondary | ICD-10-CM | POA: Diagnosis present

## 2019-05-05 DIAGNOSIS — Z9981 Dependence on supplemental oxygen: Secondary | ICD-10-CM

## 2019-05-05 DIAGNOSIS — I10 Essential (primary) hypertension: Secondary | ICD-10-CM | POA: Diagnosis present

## 2019-05-05 DIAGNOSIS — E785 Hyperlipidemia, unspecified: Secondary | ICD-10-CM | POA: Diagnosis present

## 2019-05-05 HISTORY — DX: Type 2 diabetes mellitus without complications: E11.9

## 2019-05-05 HISTORY — DX: Solitary pulmonary nodule: R91.1

## 2019-05-05 HISTORY — DX: Chronic obstructive pulmonary disease, unspecified: J44.9

## 2019-05-05 HISTORY — DX: Obstructive sleep apnea (adult) (pediatric): G47.33

## 2019-05-05 HISTORY — DX: Nontoxic goiter, unspecified: E04.9

## 2019-05-05 HISTORY — DX: Essential (primary) hypertension: I10

## 2019-05-05 HISTORY — DX: Hyperlipidemia, unspecified: E78.5

## 2019-05-05 LAB — BASIC METABOLIC PANEL
Anion gap: 9 (ref 5–15)
BUN: 17 mg/dL (ref 8–23)
CO2: 27 mmol/L (ref 22–32)
Calcium: 9.4 mg/dL (ref 8.9–10.3)
Chloride: 107 mmol/L (ref 98–111)
Creatinine, Ser: 1 mg/dL (ref 0.44–1.00)
GFR calc Af Amer: 59 mL/min — ABNORMAL LOW (ref >60)
GFR calc non Af Amer: 51 mL/min — ABNORMAL LOW (ref >60)
Glucose, Bld: 123 mg/dL — ABNORMAL HIGH (ref 70–99)
Potassium: 4.3 mmol/L (ref 3.5–5.1)
Sodium: 143 mmol/L (ref 135–145)

## 2019-05-05 LAB — CBC
HCT: 37.8 % (ref 36.0–46.0)
Hemoglobin: 11.4 g/dL — ABNORMAL LOW (ref 12.0–15.0)
MCH: 24.8 pg — ABNORMAL LOW (ref 26.0–34.0)
MCHC: 30.2 g/dL (ref 30.0–36.0)
MCV: 82.4 fL (ref 80.0–100.0)
Platelets: 222 10*3/uL (ref 150–400)
RBC: 4.59 MIL/uL (ref 3.87–5.11)
RDW: 16.4 % — ABNORMAL HIGH (ref 11.5–15.5)
WBC: 11.3 10*3/uL — ABNORMAL HIGH (ref 4.0–10.5)
nRBC: 0 % (ref 0.0–0.2)
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
## 2019-05-06 ENCOUNTER — Encounter (HOSPITAL_COMMUNITY): Payer: Self-pay | Admitting: Internal Medicine

## 2019-05-06 ENCOUNTER — Emergency Department (HOSPITAL_COMMUNITY): Payer: Medicare Other

## 2019-05-06 ENCOUNTER — Other Ambulatory Visit: Payer: Self-pay

## 2019-05-06 ENCOUNTER — Observation Stay (HOSPITAL_BASED_OUTPATIENT_CLINIC_OR_DEPARTMENT_OTHER): Payer: Medicare Other

## 2019-05-06 DIAGNOSIS — N1831 Chronic kidney disease, stage 3a: Secondary | ICD-10-CM | POA: Diagnosis present

## 2019-05-06 DIAGNOSIS — E119 Type 2 diabetes mellitus without complications: Secondary | ICD-10-CM

## 2019-05-06 DIAGNOSIS — I10 Essential (primary) hypertension: Secondary | ICD-10-CM | POA: Diagnosis present

## 2019-05-06 DIAGNOSIS — R911 Solitary pulmonary nodule: Secondary | ICD-10-CM | POA: Diagnosis present

## 2019-05-06 DIAGNOSIS — E049 Nontoxic goiter, unspecified: Secondary | ICD-10-CM | POA: Diagnosis present

## 2019-05-06 DIAGNOSIS — R55 Syncope and collapse: Secondary | ICD-10-CM

## 2019-05-06 DIAGNOSIS — J441 Chronic obstructive pulmonary disease with (acute) exacerbation: Secondary | ICD-10-CM | POA: Diagnosis not present

## 2019-05-06 DIAGNOSIS — J449 Chronic obstructive pulmonary disease, unspecified: Secondary | ICD-10-CM | POA: Diagnosis present

## 2019-05-06 DIAGNOSIS — I340 Nonrheumatic mitral (valve) insufficiency: Secondary | ICD-10-CM | POA: Diagnosis not present

## 2019-05-06 DIAGNOSIS — R0602 Shortness of breath: Secondary | ICD-10-CM | POA: Diagnosis not present

## 2019-05-06 DIAGNOSIS — G4733 Obstructive sleep apnea (adult) (pediatric): Secondary | ICD-10-CM | POA: Diagnosis present

## 2019-05-06 DIAGNOSIS — Z20822 Contact with and (suspected) exposure to covid-19: Secondary | ICD-10-CM | POA: Diagnosis not present

## 2019-05-06 DIAGNOSIS — J9611 Chronic respiratory failure with hypoxia: Secondary | ICD-10-CM | POA: Diagnosis present

## 2019-05-06 DIAGNOSIS — E785 Hyperlipidemia, unspecified: Secondary | ICD-10-CM | POA: Diagnosis present

## 2019-05-06 LAB — TSH: TSH: 0.024 u[IU]/mL — ABNORMAL LOW (ref 0.350–4.500)

## 2019-05-06 LAB — GLUCOSE, CAPILLARY
Glucose-Capillary: 129 mg/dL — ABNORMAL HIGH (ref 70–99)
Glucose-Capillary: 123 mg/dL — ABNORMAL HIGH (ref 70–99)

## 2019-05-06 LAB — D-DIMER, QUANTITATIVE: D-Dimer, Quant: 0.53 ug/mL-FEU — ABNORMAL HIGH (ref 0.00–0.50)

## 2019-05-06 LAB — CBG MONITORING, ED: Glucose-Capillary: 224 mg/dL — ABNORMAL HIGH (ref 70–99)

## 2019-05-06 LAB — POC SARS CORONAVIRUS 2 AG -  ED: SARS Coronavirus 2 Ag: NEGATIVE

## 2019-05-06 LAB — TROPONIN I (HIGH SENSITIVITY)
Troponin I (High Sensitivity): 5 ng/L (ref <18)
Troponin I (High Sensitivity): 4 ng/L (ref <18)

## 2019-05-06 LAB — SARS CORONAVIRUS 2 (TAT 6-24 HRS): SARS Coronavirus 2: NEGATIVE

## 2019-05-06 LAB — BRAIN NATRIURETIC PEPTIDE: B Natriuretic Peptide: 108.1 pg/mL — ABNORMAL HIGH (ref 0.0–100.0)

## 2019-05-06 LAB — ECHOCARDIOGRAM COMPLETE

## 2019-05-06 MED ORDER — ATORVASTATIN CALCIUM 10 MG PO TABS
20.0000 mg | ORAL_TABLET | Freq: Every day | ORAL | Status: DC
  Administered 2019-05-06 – 2019-05-08 (×3): 20 mg via ORAL
  Filled 2019-05-06 (×3): qty 2

## 2019-05-06 MED ORDER — INSULIN ASPART 100 UNIT/ML ~~LOC~~ SOLN
0.0000 [IU] | Freq: Three times a day (TID) | SUBCUTANEOUS | Status: DC
  Administered 2019-05-06: 2 [IU] via SUBCUTANEOUS
  Administered 2019-05-06: 5 [IU] via SUBCUTANEOUS

## 2019-05-06 MED ORDER — SODIUM CHLORIDE 0.9% FLUSH
3.0000 mL | Freq: Two times a day (BID) | INTRAVENOUS | Status: DC
  Administered 2019-05-06 – 2019-05-08 (×5): 3 mL via INTRAVENOUS

## 2019-05-06 MED ORDER — METHYLPREDNISOLONE SODIUM SUCC 125 MG IJ SOLR
60.0000 mg | Freq: Once | INTRAMUSCULAR | Status: AC
  Administered 2019-05-06: 01:00:00 60 mg via INTRAVENOUS
  Filled 2019-05-06: qty 2

## 2019-05-06 MED ORDER — IPRATROPIUM-ALBUTEROL 0.5-2.5 (3) MG/3ML IN SOLN
3.0000 mL | Freq: Two times a day (BID) | RESPIRATORY_TRACT | Status: DC
  Administered 2019-05-06 – 2019-05-08 (×5): 3 mL via RESPIRATORY_TRACT
  Filled 2019-05-06 (×5): qty 3

## 2019-05-06 MED ORDER — POLYETHYLENE GLYCOL 3350 17 G PO PACK
17.0000 g | PACK | Freq: Every day | ORAL | Status: DC | PRN
  Administered 2019-05-08: 17 g via ORAL
  Filled 2019-05-06: qty 1

## 2019-05-06 MED ORDER — PANTOPRAZOLE SODIUM 20 MG PO TBEC
20.0000 mg | DELAYED_RELEASE_TABLET | Freq: Every day | ORAL | Status: DC
  Administered 2019-05-06 – 2019-05-08 (×3): 20 mg via ORAL
  Filled 2019-05-06 (×3): qty 1

## 2019-05-06 MED ORDER — ALPRAZOLAM 0.25 MG PO TABS
0.2500 mg | ORAL_TABLET | Freq: Every day | ORAL | Status: DC | PRN
  Administered 2019-05-06 – 2019-05-07 (×2): 0.25 mg via ORAL
  Filled 2019-05-06 (×2): qty 1

## 2019-05-06 MED ORDER — ACETAMINOPHEN 325 MG PO TABS: 650.0000 mg | ORAL_TABLET | Freq: Four times a day (QID) | ORAL | Status: DC | PRN

## 2019-05-06 MED ORDER — METOPROLOL SUCCINATE ER 50 MG PO TB24
50.0000 mg | ORAL_TABLET | Freq: Every day | ORAL | Status: DC
  Administered 2019-05-06 – 2019-05-07 (×2): 50 mg via ORAL
  Filled 2019-05-06: qty 1
  Filled 2019-05-06: qty 2

## 2019-05-06 MED ORDER — IOHEXOL 350 MG/ML SOLN
100.0000 mL | Freq: Once | INTRAVENOUS | Status: AC | PRN
  Administered 2019-05-06: 05:00:00 100 mL via INTRAVENOUS

## 2019-05-06 MED ORDER — LEVALBUTEROL TARTRATE 45 MCG/ACT IN AERO
2.0000 | INHALATION_SPRAY | Freq: Four times a day (QID) | RESPIRATORY_TRACT | Status: DC | PRN
  Filled 2019-05-06: qty 15

## 2019-05-06 MED ORDER — ENOXAPARIN SODIUM 40 MG/0.4ML ~~LOC~~ SOLN
40.0000 mg | SUBCUTANEOUS | Status: DC
  Administered 2019-05-06 – 2019-05-08 (×3): 40 mg via SUBCUTANEOUS
  Filled 2019-05-06 (×3): qty 0.4

## 2019-05-06 MED ORDER — LATANOPROST 0.005 % OP SOLN
1.0000 [drp] | Freq: Two times a day (BID) | OPHTHALMIC | Status: DC
  Administered 2019-05-06 – 2019-05-08 (×5): 1 [drp] via OPHTHALMIC
  Filled 2019-05-06: qty 2.5

## 2019-05-06 MED ORDER — LACTATED RINGERS IV SOLN: INTRAVENOUS | Status: DC

## 2019-05-06 MED ORDER — ACETAMINOPHEN 650 MG RE SUPP: 650.0000 mg | Freq: Four times a day (QID) | RECTAL | Status: DC | PRN

## 2019-05-06 MED ORDER — VERAPAMIL HCL ER 240 MG PO TBCR
240.0000 mg | EXTENDED_RELEASE_TABLET | Freq: Every day | ORAL | Status: DC
  Administered 2019-05-06: 240 mg via ORAL
  Filled 2019-05-06 (×2): qty 1

## 2019-05-06 MED ORDER — MORPHINE SULFATE (PF) 2 MG/ML IV SOLN: 2.0000 mg | INTRAVENOUS | Status: DC | PRN

## 2019-05-06 MED ORDER — DOCUSATE SODIUM 100 MG PO CAPS
100.0000 mg | ORAL_CAPSULE | Freq: Two times a day (BID) | ORAL | Status: DC
  Administered 2019-05-06 – 2019-05-08 (×5): 100 mg via ORAL
  Filled 2019-05-06 (×6): qty 1

## 2019-05-06 MED ORDER — ONDANSETRON HCL 4 MG/2ML IJ SOLN: 4.0000 mg | Freq: Four times a day (QID) | INTRAMUSCULAR | Status: DC | PRN

## 2019-05-06 MED ORDER — HYDROCODONE-ACETAMINOPHEN 5-325 MG PO TABS: 1.0000 | ORAL_TABLET | ORAL | Status: DC | PRN

## 2019-05-06 MED ORDER — ALBUTEROL SULFATE HFA 108 (90 BASE) MCG/ACT IN AERS
4.0000 | INHALATION_SPRAY | Freq: Once | RESPIRATORY_TRACT | Status: AC
  Administered 2019-05-06: 4 via RESPIRATORY_TRACT
  Filled 2019-05-06: qty 6.7

## 2019-05-06 MED ORDER — ZOLPIDEM TARTRATE 5 MG PO TABS: 5.0000 mg | ORAL_TABLET | Freq: Every evening | ORAL | Status: DC | PRN

## 2019-05-06 MED ORDER — BISACODYL 5 MG PO TBEC: 5.0000 mg | DELAYED_RELEASE_TABLET | Freq: Every day | ORAL | Status: DC | PRN

## 2019-05-06 MED ORDER — HYDRALAZINE HCL 20 MG/ML IJ SOLN
5.0000 mg | INTRAMUSCULAR | Status: DC | PRN
  Administered 2019-05-06: 5 mg via INTRAVENOUS
  Filled 2019-05-06: qty 1

## 2019-05-06 MED ORDER — ONDANSETRON HCL 4 MG PO TABS: 4.0000 mg | ORAL_TABLET | Freq: Four times a day (QID) | ORAL | Status: DC | PRN

## 2019-05-06 MED ORDER — BRIMONIDINE TARTRATE 0.15 % OP SOLN
1.0000 [drp] | Freq: Every day | OPHTHALMIC | Status: DC
  Administered 2019-05-06 – 2019-05-08 (×3): 1 [drp] via OPHTHALMIC
  Filled 2019-05-06: qty 5
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
## 2019-05-06 NOTE — ED Notes (Signed)
Patient walked to bathroom and back. Oxygen dropped to 91% but upon getting settled in the bed back up to 97%. States she does not feel short of breath or dizzy or light-headed.
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
## 2019-05-06 NOTE — Progress Notes (Signed)
  Echocardiogram 2D Echocardiogram has been performed.  Pieter Partridge 05/06/2019, 11:15 AM

## 2019-05-07 ENCOUNTER — Inpatient Hospital Stay (HOSPITAL_COMMUNITY): Payer: Medicare Other

## 2019-05-07 DIAGNOSIS — N1831 Chronic kidney disease, stage 3a: Secondary | ICD-10-CM | POA: Diagnosis present

## 2019-05-07 DIAGNOSIS — I5032 Chronic diastolic (congestive) heart failure: Secondary | ICD-10-CM | POA: Diagnosis present

## 2019-05-07 DIAGNOSIS — M7989 Other specified soft tissue disorders: Secondary | ICD-10-CM | POA: Diagnosis present

## 2019-05-07 DIAGNOSIS — R42 Dizziness and giddiness: Secondary | ICD-10-CM | POA: Diagnosis not present

## 2019-05-07 DIAGNOSIS — Z20822 Contact with and (suspected) exposure to covid-19: Secondary | ICD-10-CM | POA: Diagnosis present

## 2019-05-07 DIAGNOSIS — J441 Chronic obstructive pulmonary disease with (acute) exacerbation: Secondary | ICD-10-CM | POA: Diagnosis present

## 2019-05-07 DIAGNOSIS — J9611 Chronic respiratory failure with hypoxia: Secondary | ICD-10-CM | POA: Diagnosis present

## 2019-05-07 DIAGNOSIS — Z9981 Dependence on supplemental oxygen: Secondary | ICD-10-CM | POA: Diagnosis not present

## 2019-05-07 DIAGNOSIS — Z7984 Long term (current) use of oral hypoglycemic drugs: Secondary | ICD-10-CM | POA: Diagnosis not present

## 2019-05-07 DIAGNOSIS — Z791 Long term (current) use of non-steroidal anti-inflammatories (NSAID): Secondary | ICD-10-CM | POA: Diagnosis not present

## 2019-05-07 DIAGNOSIS — I130 Hypertensive heart and chronic kidney disease with heart failure and stage 1 through stage 4 chronic kidney disease, or unspecified chronic kidney disease: Secondary | ICD-10-CM | POA: Diagnosis present

## 2019-05-07 DIAGNOSIS — Z885 Allergy status to narcotic agent status: Secondary | ICD-10-CM | POA: Diagnosis not present

## 2019-05-07 DIAGNOSIS — E049 Nontoxic goiter, unspecified: Secondary | ICD-10-CM | POA: Diagnosis present

## 2019-05-07 DIAGNOSIS — Z87891 Personal history of nicotine dependence: Secondary | ICD-10-CM | POA: Diagnosis not present

## 2019-05-07 DIAGNOSIS — Z66 Do not resuscitate: Secondary | ICD-10-CM | POA: Diagnosis present

## 2019-05-07 DIAGNOSIS — R911 Solitary pulmonary nodule: Secondary | ICD-10-CM | POA: Diagnosis present

## 2019-05-07 DIAGNOSIS — Z79899 Other long term (current) drug therapy: Secondary | ICD-10-CM | POA: Diagnosis not present

## 2019-05-07 DIAGNOSIS — E785 Hyperlipidemia, unspecified: Secondary | ICD-10-CM | POA: Diagnosis present

## 2019-05-07 DIAGNOSIS — R55 Syncope and collapse: Secondary | ICD-10-CM | POA: Diagnosis present

## 2019-05-07 DIAGNOSIS — E1122 Type 2 diabetes mellitus with diabetic chronic kidney disease: Secondary | ICD-10-CM | POA: Diagnosis present

## 2019-05-07 DIAGNOSIS — G4733 Obstructive sleep apnea (adult) (pediatric): Secondary | ICD-10-CM | POA: Diagnosis present

## 2019-05-07 DIAGNOSIS — Z8249 Family history of ischemic heart disease and other diseases of the circulatory system: Secondary | ICD-10-CM | POA: Diagnosis not present

## 2019-05-07 DIAGNOSIS — Z888 Allergy status to other drugs, medicaments and biological substances status: Secondary | ICD-10-CM | POA: Diagnosis not present

## 2019-05-07 LAB — BASIC METABOLIC PANEL
Anion gap: 9 (ref 5–15)
BUN: 20 mg/dL (ref 8–23)
CO2: 26 mmol/L (ref 22–32)
Calcium: 9.7 mg/dL (ref 8.9–10.3)
Chloride: 106 mmol/L (ref 98–111)
Creatinine, Ser: 0.83 mg/dL (ref 0.44–1.00)
GFR calc Af Amer: >60 mL/min (ref >60)
GFR calc non Af Amer: >60 mL/min (ref >60)
Glucose, Bld: 111 mg/dL — ABNORMAL HIGH (ref 70–99)
Potassium: 4.6 mmol/L (ref 3.5–5.1)
Sodium: 141 mmol/L (ref 135–145)

## 2019-05-07 LAB — T4, FREE: Free T4: 0.91 ng/dL (ref 0.61–1.12)

## 2019-05-07 LAB — CBC
HCT: 37.8 % (ref 36.0–46.0)
Hemoglobin: 11.5 g/dL — ABNORMAL LOW (ref 12.0–15.0)
MCH: 24.6 pg — ABNORMAL LOW (ref 26.0–34.0)
MCHC: 30.4 g/dL (ref 30.0–36.0)
MCV: 80.9 fL (ref 80.0–100.0)
Platelets: 207 10*3/uL (ref 150–400)
RBC: 4.67 MIL/uL (ref 3.87–5.11)
RDW: 16.1 % — ABNORMAL HIGH (ref 11.5–15.5)
WBC: 14.1 10*3/uL — ABNORMAL HIGH (ref 4.0–10.5)
nRBC: 0 % (ref 0.0–0.2)

## 2019-05-07 LAB — GLUCOSE, CAPILLARY
Glucose-Capillary: 107 mg/dL — ABNORMAL HIGH (ref 70–99)
Glucose-Capillary: 82 mg/dL (ref 70–99)
Glucose-Capillary: 83 mg/dL (ref 70–99)

## 2019-05-07 LAB — HEMOGLOBIN A1C
Hgb A1c MFr Bld: 6.1 % — ABNORMAL HIGH (ref 4.8–5.6)
Mean Plasma Glucose: 128 mg/dL

## 2019-05-07 MED ORDER — METOPROLOL SUCCINATE ER 50 MG PO TB24
50.0000 mg | ORAL_TABLET | Freq: Every day | ORAL | Status: DC
  Administered 2019-05-08: 50 mg via ORAL
  Filled 2019-05-07: qty 1

## 2019-05-07 MED ORDER — METOPROLOL SUCCINATE ER 25 MG PO TB24: 25.0000 mg | ORAL_TABLET | Freq: Every day | ORAL | Status: DC

## 2019-05-07 MED ORDER — FUROSEMIDE 10 MG/ML IJ SOLN
20.0000 mg | Freq: Once | INTRAMUSCULAR | Status: AC
  Administered 2019-05-07: 20 mg via INTRAVENOUS
  Filled 2019-05-07: qty 2

## 2019-05-07 MED ORDER — AMLODIPINE BESYLATE 5 MG PO TABS
5.0000 mg | ORAL_TABLET | Freq: Every day | ORAL | Status: DC
  Administered 2019-05-08: 5 mg via ORAL
  Filled 2019-05-07: qty 1

## 2019-05-07 MED ORDER — FUROSEMIDE 10 MG/ML IJ SOLN: 20.0000 mg | Freq: Once | INTRAMUSCULAR | Status: DC
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
## 2019-05-07 NOTE — Progress Notes (Signed)
Carotid artery duplex completed. Refer to "CV Proc" under chart review to view preliminary results.  05/07/2019 3:06 PM Eula Fried., MHA, RVT, RDCS, RDMS

## 2019-05-07 NOTE — Evaluation (Signed)
Physical Therapy Evaluation Patient Details Name: Heather Aguilar MRN: [id number] DOB: 23-Oct-1930 Today's Date: 05/07/2019   History of Present Illness  86 yo female was admitted for sudden episode of feeling like she might pass out, and SOB.  Has had this about a month ago.  Pt has been cleared for PE.  PMHx:  emphysema, goiter, OSA, COPD with 2L O2, HTN, HLD, DM, pulm nodule, chronic respiratory failure with hypoxia, CKD 3,   Clinical Impression  Pt was seen for evaluation of gait and transfers, and noted that she is safe to walk with support for O2 tank.  Pt has 95% sat with pulse 86 on walk.  Pt will be seen for balance and strengthening to increase safety and independence with gait.  Follow acutely for increasing safety of gait, and will work toward dc to home with HHPT.    Follow Up Recommendations Home health PT;Supervision for mobility/OOB    Equipment Recommendations  None recommended by PT    Recommendations for Other Services       Precautions / Restrictions Precautions Precautions: Fall Precaution Comments: ck vitals Restrictions Weight Bearing Restrictions: No      Mobility  Bed Mobility Overal bed mobility: Modified Independent                Transfers Overall transfer level: Needs assistance Equipment used: 1 person hand held assist Transfers: Sit to/from Stand Sit to Stand: Min guard            Ambulation/Gait Ambulation/Gait assistance: Min guard Gait Distance (Feet): 100 Feet Assistive device: 1 person hand held assist Gait Pattern/deviations: (O2 cart) Gait velocity: reduced Gait velocity interpretation: <1.31 ft/sec, indicative of household ambulator General Gait Details: walked with O2 cart to hold O2 tank, using O2 at home  Stairs            Wheelchair Mobility    Modified Rankin (Stroke Patients Only)       Balance Overall balance assessment: Needs assistance Sitting-balance support: Feet supported Sitting balance-Leahy  Scale: Good   Postural control: Posterior lean Standing balance support: Bilateral upper extremity supported;During functional activity Standing balance-Leahy Scale: Fair                               Pertinent Vitals/Pain Pain Assessment: No/denies pain    Home Living Family/patient expects to be discharged to:: Private residence Living Arrangements: Other relatives Available Help at Discharge: Family;Available 24 hours/day Type of Home: House Home Access: Stairs to enter Entrance Stairs-Rails: Can reach both Entrance Stairs-Number of Steps: 3 Home Layout: One level Home Equipment: None      Prior Function Level of Independence: Independent               Hand Dominance   Dominant Hand: Right    Extremity/Trunk Assessment   Upper Extremity Assessment Upper Extremity Assessment: Overall WFL for tasks assessed    Lower Extremity Assessment Lower Extremity Assessment: LLE deficits/detail LLE Deficits / Details: 4- to 4    Cervical / Trunk Assessment Cervical / Trunk Assessment: Kyphotic  Communication   Communication: No difficulties  Cognition Arousal/Alertness: Awake/alert Behavior During Therapy: WFL for tasks assessed/performed Overall Cognitive Status: Within Functional Limits for tasks assessed  General Comments General comments (skin integrity, edema, etc.): pt was seen for moblity of gait with no formal AD but does control her balance on hallway with min guard.  Has been able to walk with no AD at home    Exercises     Assessment/Plan    PT Assessment Patient needs continued PT services  PT Problem List Decreased strength;Decreased activity tolerance;Decreased balance;Decreased mobility;Decreased coordination;Cardiopulmonary status limiting activity       PT Treatment Interventions DME instruction;Gait training;Stair training;Functional mobility training;Therapeutic  activities;Therapeutic exercise;Balance training;Neuromuscular re-education;Patient/family education    PT Goals (Current goals can be found in the Care Plan section)  Acute Rehab PT Goals Patient Stated Goal: to get home and feel better PT Goal Formulation: With patient Time For Goal Achievement: 05/21/19 Potential to Achieve Goals: Good    Frequency Min 3X/week   Barriers to discharge Inaccessible home environment stairs to enter house    Co-evaluation               AM-PAC PT "6 Clicks" Mobility  Outcome Measure Help needed turning from your back to your side while in a flat bed without using bedrails?: None Help needed moving from lying on your back to sitting on the side of a flat bed without using bedrails?: A Little Help needed moving to and from a bed to a chair (including a wheelchair)?: A Little Help needed standing up from a chair using your arms (e.g., wheelchair or bedside chair)?: A Little Help needed to walk in hospital room?: A Little Help needed climbing 3-5 steps with a railing? : A Lot 6 Click Score: 18    End of Session Equipment Utilized During Treatment: Gait belt;Oxygen Activity Tolerance: Patient tolerated treatment well;Other (comment);Treatment limited secondary to medical complications (Comment)(requires O2) Patient left: in bed;with call bell/phone within reach;with bed alarm set Nurse Communication: Mobility status PT Visit Diagnosis: Unsteadiness on feet (R26.81);Muscle weakness (generalized) (M62.81);Difficulty in walking, not elsewhere classified (R26.2)    Time: 0814-4818 PT Time Calculation (min) (ACUTE ONLY): 21 min   Charges:   PT Evaluation $PT Eval Moderate Complexity: 1 Mod         Ivar Drape 05/07/2019, 8:05 PM  Samul Dada, PT MS Acute Rehab Dept. Number: [facility] [phone] and [facility] [phone]

## 2019-05-08 ENCOUNTER — Other Ambulatory Visit: Payer: Self-pay | Admitting: Medical

## 2019-05-08 ENCOUNTER — Telehealth: Payer: Self-pay | Admitting: *Deleted

## 2019-05-08 ENCOUNTER — Encounter (HOSPITAL_COMMUNITY): Payer: Self-pay | Admitting: Internal Medicine

## 2019-05-08 DIAGNOSIS — R002 Palpitations: Secondary | ICD-10-CM

## 2019-05-08 LAB — BASIC METABOLIC PANEL
Anion gap: 10 (ref 5–15)
BUN: 20 mg/dL (ref 8–23)
CO2: 29 mmol/L (ref 22–32)
Calcium: 9.3 mg/dL (ref 8.9–10.3)
Chloride: 102 mmol/L (ref 98–111)
Creatinine, Ser: 0.95 mg/dL (ref 0.44–1.00)
GFR calc Af Amer: >60 mL/min (ref >60)
GFR calc non Af Amer: 54 mL/min — ABNORMAL LOW (ref >60)
Glucose, Bld: 171 mg/dL — ABNORMAL HIGH (ref 70–99)
Potassium: 3.6 mmol/L (ref 3.5–5.1)
Sodium: 141 mmol/L (ref 135–145)

## 2019-05-08 LAB — GLUCOSE, CAPILLARY
Glucose-Capillary: 87 mg/dL (ref 70–99)
Glucose-Capillary: 101 mg/dL — ABNORMAL HIGH (ref 70–99)
Glucose-Capillary: 90 mg/dL (ref 70–99)

## 2019-05-08 LAB — MAGNESIUM: Magnesium: 1.8 mg/dL (ref 1.7–2.4)

## 2019-05-08 MED ORDER — AMLODIPINE BESYLATE 5 MG PO TABS: 5.0000 mg | ORAL_TABLET | Freq: Every day | ORAL | 0 refills | Status: AC

## 2019-05-08 NOTE — Progress Notes (Signed)
Verified with cardiology Pa, Cadence Furth, pt will not have 30 day monitor in hand when she leaves today, they will call pt arrange set up for the monitor. Antony Contras, Charity fundraiser, BSN

## 2019-05-08 NOTE — Discharge Summary (Signed)
Physician Discharge Summary  MARDELLA NUCKLES PYP:[id number] DOB: 20-Mar-1930 DOA: 05/05/2019  PCP: Bobbye Morton, MD  Admit date: 05/05/2019 Discharge date: 05/08/2019  Admitted From: Home Discharge disposition: Home   Recommendations for Outpatient Follow-Up:   1. Outpatient event monitor 2. Have changed patient's blood pressure medications due to bradycardia 3. Patient declined home health 4. PT chest CT in 6 months   Discharge Diagnosis:   Principal Problem:   Near syncope Active Problems:   Thyroid goiter   Pulmonary nodule   OSA (obstructive sleep apnea)   Essential hypertension   Dyslipidemia   Diabetes mellitus (HCC)   COPD (chronic obstructive pulmonary disease) (HCC)   Chronic respiratory failure with hypoxia (HCC)   Stage 3a chronic kidney disease    Discharge Condition: Improved.  Diet recommendation: Low sodium, heart healthy.  Carbohydrate-modified.  Wound care: None.  Code status: Full.   History of Present Illness:   Heather Aguilar is a 86 y.o. female with medical history significant of OSA; DM; COPD on home 2L O2; HTN; thyroid goiter; and HLD presenting with near syncope.  She reports that she felt like she was going to pass out.  She was sitting at the table when it happened and she got SOB.  It was just the one episode, but she had a similar episode about a month ago.  She felt very light-headed.  It lasted about 4-5 minutes.  The SOB stayed longer.  No cough.  No CP or palpitations.  No recent Echo.  The episode happened about 8 or 9pm.    Hospital Course by Problem:   Near syncope -Etiology is not clear- patient was sitting at the table and became acutely light-headed and felt like she would lose consciousness, with resultant SOB -Pre-syncope symptoms resolved within 5 minutes, SOB took longer The differential diagnosis is broad, including vasovagal syncope, seizure, TIA/stroke, ACS, orthostatic status, carotid artery stenosis,  arrhythmia, anxiety attack -Orthostatic vital signs negative -Troponinnegative x 2 -2d echo: Left ventricular ejection fraction, by estimation, is 65 to 70%. The  left ventricle has normal function. The left ventricle has no regional  wall motion abnormalities. There is moderate left ventricular hypertrophy  of the basal-septal segment. Left  ventricular diastolic parameters are consistent with Grade I diastolic  dysfunction (impaired relaxation).  -patient seems to endorse pulsatile tinnitus:  carotid duplex: Normal  Chronic respiratory failure, on home O2, due to COPD -She is on 2L home O2 and did not increase her O2 -The SOB that occurred post-syncope has since resolved -No acute findings of CTA, including no evidence of PE  OSA on CPAP -Continue CPAP  HTN -Continue Toprol XL -d/c Verapamil due to HRs of 30s -add norvasc   DM -A1c 6.1 -hold Glucophage -Cover with moderate-scale SSI  HLD -Continue Lipitor  Thyroid goiter -Patient reportedly knows about the goiter -Outpatient f/u  Pulmonary nodule -LLL opacity on CTA, possibly larger than prior -Repeat CT in 6 months    Medical Consultants:   Cardiology   Discharge Exam:   Vitals:   05/08/19 0829 05/08/19 1234  BP: (!) 154/65 126/67  Pulse: 98 87  Resp: 18 18  Temp: 98.2 F (36.8 C) 98.1 F (36.7 C)  SpO2: 93% 94%   Vitals:   05/08/19 0610 05/08/19 0750 05/08/19 0829 05/08/19 1234  BP: 129/71  (!) 154/65 126/67  Pulse: 78  98 87  Resp: 18  18 18   Temp: 97.9 F (36.6 C)  98.2  F (36.8 C) 98.1 F (36.7 C)  TempSrc: Oral  Oral Oral  SpO2: 95% 90% 93% 94%  Weight:      Height:        General exam: Appears calm and comfortable.   The results of significant diagnostics from this hospitalization (including imaging, microbiology, ancillary and laboratory) are listed below for reference.     Procedures and Diagnostic Studies:   DG Chest 2 View  Result Date: 05/05/2019 CLINICAL DATA:   Shortness of breath. EXAM: CHEST - 2 VIEW COMPARISON:  March 26, 2017 FINDINGS: Mild to moderate severity diffuse chronic appearing increased interstitial lung markings are seen. There is no evidence of acute infiltrate, pleural effusion or pneumothorax. The heart size and mediastinal contours are within normal limits. The visualized skeletal structures are unremarkable. IMPRESSION: Chronic-appearing increased interstitial lung markings without evidence of acute or active cardiopulmonary disease. Electronically Signed   By: Aram Candela M.D.   On: 05/05/2019 23:33   CT Angio Chest PE W and/or Wo Contrast  Result Date: 05/06/2019 CLINICAL DATA:  Increased short of breath. EXAM: CT ANGIOGRAPHY CHEST WITH CONTRAST TECHNIQUE: Multidetector CT imaging of the chest was performed using the standard protocol during bolus administration of intravenous contrast. Multiplanar CT image reconstructions and MIPs were obtained to evaluate the vascular anatomy. CONTRAST:  OMNIPAQUE IOHEXOL 350 MG/ML SOLN COMPARISON:  None. FINDINGS: Cardiovascular: No filling defects within the pulmonary arteries to suggest acute pulmonary embolism. Mediastinum/Nodes: No axillary adenopathy. Bulky LEFT supraclavicular mass measuring 4.1 cm (image 1/5) extends into the thoracic inlet. This appears to originate from the thyroid gland. ). Nodular component extends posterior to the trachea above the carina measuring 2.2 cm. exam. Findings similar to CT 12/06/2012 Lungs/Pleura: Extensive centrilobular emphysema the upper lobes. No suspicious nodularity. Airspace disease. No pulmonary infarction. Small focus consolidation LEFT lower lobe measuring 2.3 cm is increased from 1.1 cm on remote comparison CT. Limited view of the liver, kidneys, pancreas are unremarkable. Normal adrenal glands. Upper Abdomen: Limited view of the liver, kidneys, pancreas are unremarkable. Normal adrenal glands. Musculoskeletal: No aggressive osseous lesion. Review  of the MIP images confirms the above findings. IMPRESSION: 1. No evidence acute pulmonary embolism. 2. Extensive centrilobular emphysema. 3. Small focus consolidation in the LEFT lower lobe is increased in size from CT 2014. Favor benign finding. Consider follow-up chest CT in 3 to 6 months. 4. 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 Electronically Signed   By: Genevive Bi M.D.   On: 05/06/2019 05:28   ECHOCARDIOGRAM COMPLETE  Result Date: 05/06/2019    ECHOCARDIOGRAM REPORT   Patient Name:   EULAMAE GREENSTEIN Otter Date of Exam: 05/06/2019 Medical Rec #:  [id number]    Height: Accession #:    [id number]   Weight: Date of Birth:  1930/07/08    BSA: Patient Age:    86 years     BP:           141/83 mmHg Patient Gender: F            HR:           91 bpm. Exam Location:  Inpatient Procedure: 2D Echo, Cardiac Doppler and Color Doppler Indications:    Syncope  History:        Patient has no prior history of Echocardiogram examinations.                 COPD, Signs/Symptoms:Shortness of Breath; Risk Factors:Diabetes,  Hypertension, Dyslipidemia and Sleep Apnea.  Sonographer:    Lavenia AtlasBrooke Strickland Referring Phys: [id number] JENNIFER YATES IMPRESSIONS  1. Left ventricular ejection fraction, by estimation, is 65 to 70%. The left ventricle has normal function. The left ventricle has no regional wall motion abnormalities. There is moderate left ventricular hypertrophy of the basal-septal segment. Left ventricular diastolic parameters are consistent with Grade I diastolic dysfunction (impaired relaxation).  2. Right ventricular systolic function is mildly reduced. The right ventricular size is normal. Tricuspid regurgitation signal is inadequate for assessing PA pressure.  3. The mitral valve is normal in structure. Mild mitral valve regurgitation. No evidence of mitral stenosis.  4. The aortic valve is tricuspid. Aortic valve regurgitation is not visualized. Mild aortic valve  sclerosis is present, with no evidence of aortic valve stenosis. FINDINGS  Left Ventricle: Left ventricular ejection fraction, by estimation, is 65 to 70%. The left ventricle has normal function. The left ventricle has no regional wall motion abnormalities. The left ventricular internal cavity size was normal in size. There is  moderate left ventricular hypertrophy of the basal-septal segment. Left ventricular diastolic parameters are consistent with Grade I diastolic dysfunction (impaired relaxation). Normal left ventricular filling pressure. Right Ventricle: The right ventricular size is normal. No increase in right ventricular wall thickness. Right ventricular systolic function is mildly reduced. Tricuspid regurgitation signal is inadequate for assessing PA pressure. Left Atrium: Left atrial size was normal in size. Right Atrium: Right atrial size was normal in size. Pericardium: There is no evidence of pericardial effusion. Mitral Valve: The mitral valve is normal in structure. Normal mobility of the mitral valve leaflets. Mild mitral valve regurgitation. No evidence of mitral valve stenosis. Tricuspid Valve: The tricuspid valve is normal in structure. Tricuspid valve regurgitation is not demonstrated. No evidence of tricuspid stenosis. Aortic Valve: The aortic valve is tricuspid. Aortic valve regurgitation is not visualized. Mild aortic valve sclerosis is present, with no evidence of aortic valve stenosis. Pulmonic Valve: The pulmonic valve was normal in structure. Pulmonic valve regurgitation is trivial. No evidence of pulmonic stenosis. Aorta: The aortic root is normal in size and structure. Venous: The inferior vena cava was not well visualized. IAS/Shunts: No atrial level shunt detected by color flow Doppler.  LEFT VENTRICLE PLAX 2D LVIDd:         3.73 cm  Diastology LVIDs:         2.67 cm  LV e' lateral:   5.44 cm/s LV PW:         1.20 cm  LV E/e' lateral: 10.3 LV IVS:        1.30 cm  LV e' medial:    4.57  cm/s LVOT diam:     1.80 cm  LV E/e' medial:  12.3 LV SV:         48 LVOT Area:     2.54 cm  RIGHT VENTRICLE RV Basal diam:  2.24 cm RV S prime:     18.60 cm/s TAPSE (M-mode): 1.6 cm LEFT ATRIUM             RIGHT ATRIUM LA diam:        4.00 cm RA Area:     8.06 cm LA Vol (A2C):   23.5 ml RA Volume:   13.60 ml LA Vol (A4C):   29.8 ml LA Biplane Vol: 27.7 ml  AORTIC VALVE LVOT Vmax:   96.80 cm/s LVOT Vmean:  61.800 cm/s LVOT VTI:    0.189 m  AORTA Ao Root diam: 2.70 cm  MITRAL VALVE MV Area (PHT): 7.37 cm    SHUNTS MV Decel Time: 103 msec    Systemic VTI:  0.19 m MV E velocity: 56.30 cm/s  Systemic Diam: 1.80 cm MV A velocity: 74.70 cm/s MV E/A ratio:  0.75 Fransico Him MD Electronically signed by Fransico Him MD Signature Date/Time: 05/06/2019/2:16:48 PM    Final      Labs:   Basic Metabolic Panel: Recent Labs  Lab 05/05/19 2225 05/05/19 2225 05/07/19 0121 05/08/19 0847  NA 143  --  141 141  K 4.3   < > 4.6 3.6  CL 107  --  106 102  CO2 27  --  26 29  GLUCOSE 123*  --  111* 171*  BUN 17  --  20 20  CREATININE 1.00  --  0.83 0.95  CALCIUM 9.4  --  9.7 9.3  MG  --   --   --  1.8   < > = values in this interval not displayed.   GFR Estimated Creatinine Clearance: 41.9 mL/min (by C-G formula based on SCr of 0.95 mg/dL). Liver Function Tests: No results for input(s): AST, ALT, ALKPHOS, BILITOT, PROT, ALBUMIN in the last 168 hours. No results for input(s): LIPASE, AMYLASE in the last 168 hours. No results for input(s): AMMONIA in the last 168 hours. Coagulation profile No results for input(s): INR, PROTIME in the last 168 hours.  CBC: Recent Labs  Lab 05/05/19 2225 05/07/19 0121  WBC 11.3* 14.1*  HGB 11.4* 11.5*  HCT 37.8 37.8  MCV 82.4 80.9  PLT 222 207   Cardiac Enzymes: No results for input(s): CKTOTAL, CKMB, CKMBINDEX, TROPONINI in the last 168 hours. BNP: Invalid input(s): POCBNP CBG: Recent Labs  Lab 05/07/19 0552 05/07/19 1052 05/07/19 1556 05/08/19 0726  05/08/19 1109  GLUCAP 107* 82 83 87 101*   D-Dimer Recent Labs    05/05/19 2225  DDIMER 0.53*   Hgb A1c Recent Labs    05/06/19 0939  HGBA1C 6.1*   Lipid Profile No results for input(s): CHOL, HDL, LDLCALC, TRIG, CHOLHDL, LDLDIRECT in the last 72 hours. Thyroid function studies Recent Labs    05/06/19 0939  TSH 0.024*   Anemia work up No results for input(s): VITAMINB12, FOLATE, FERRITIN, TIBC, IRON, RETICCTPCT in the last 72 hours. Microbiology Recent Results (from the past 240 hour(s))  SARS CORONAVIRUS 2 (TAT 6-24 HRS) Nasopharyngeal Nasopharyngeal Swab     Status: None   Collection Time: 05/06/19  8:26 AM   Specimen: Nasopharyngeal Swab  Result Value Ref Range Status   SARS Coronavirus 2 NEGATIVE NEGATIVE Final    Comment: (NOTE) SARS-CoV-2 target nucleic acids are NOT DETECTED. The SARS-CoV-2 RNA is generally detectable in upper and lower respiratory specimens during the acute phase of infection. Negative results do not preclude SARS-CoV-2 infection, do not rule out co-infections with other pathogens, and should not be used as the sole basis for treatment or other patient management decisions. Negative results must be combined with clinical observations, patient history, and epidemiological information. The expected result is Negative. Fact Sheet for Patients: SugarRoll.be Fact Sheet for Healthcare Providers: https://www.woods-mathews.com/ This test is not yet approved or cleared by the [place] FDA and  has been authorized for detection and/or diagnosis of SARS-CoV-2 by FDA under an Emergency Use Authorization (EUA). This EUA will remain  in effect (meaning this test can be used) for the duration of the COVID-19 declaration under Section 56 4(b)(1) of the Act, 21 U.S.C. section 360bbb-3(b)(1), unless the  authorization is terminated or revoked sooner. Performed at [facility], [address],  [place], [place] [address]      Discharge Instructions:   Discharge Instructions    Diet - low sodium heart healthy   Complete by: As directed    Diet Carb Modified   Complete by: As directed    Discharge instructions   Complete by: As directed    30 day event monitor   Increase activity slowly   Complete by: As directed      Allergies as of 05/08/2019      Reactions   Demerol [meperidine] Hypertension   "Raised my blood pressure"   Talwin [pentazocine] Other (See Comments)   Caused hallucinations      Medication List    STOP taking these medications   verapamil 120 MG CR tablet Commonly known as: CALAN-SR     TAKE these medications   acetaminophen 500 MG tablet Commonly known as: TYLENOL Take 500-1,000 mg by mouth every 6 (six) hours as needed for mild pain or headache.   Alphagan P 0.15 % ophthalmic solution Generic drug: brimonidine Place 1 drop into both eyes daily.   ALPRAZolam 0.25 MG tablet Commonly known as: XANAX Take 0.25 mg by mouth daily as needed for anxiety.   amLODipine 5 MG tablet Commonly known as: NORVASC Take 1 tablet (5 mg total) by mouth daily. Start taking on: May 09, 2019   ascorbic acid 500 MG tablet Commonly known as: VITAMIN C Take 500-1,000 mg by mouth daily.   atorvastatin 20 MG tablet Commonly known as: LIPITOR Take 20 mg by mouth daily.   ferrous sulfate 325 (65 FE) MG tablet Take 325 mg by mouth daily with lunch.   furosemide 20 MG tablet Commonly known as: LASIX Take 20 mg by mouth daily as needed (for swelling of the ankles).   ipratropium-albuterol 0.5-2.5 (3) MG/3ML Soln Commonly known as: DUONEB Take 3 mLs by nebulization 2 (two) times daily.   lansoprazole 30 MG capsule Commonly known as: PREVACID Take 30 mg by mouth daily before breakfast.   levalbuterol 45 MCG/ACT inhaler Commonly known as: XOPENEX HFA Inhale 2 puffs into the lungs every 6 (six) hours as needed for shortness of breath.   meloxicam 15 MG  tablet Commonly known as: MOBIC Take 15 mg by mouth daily as needed for pain.   metFORMIN 500 MG tablet Commonly known as: GLUCOPHAGE Take 500 mg by mouth 2 (two) times daily.   metoprolol succinate 50 MG 24 hr tablet Commonly known as: TOPROL-XL Take 50 mg by mouth daily.   OXYGEN Inhale 2 L/min into the lungs continuous.   PRESCRIPTION MEDICATION CPAP- At bedtime and during any times of rest (WITH OXYGEN)   Travatan Z 0.004 % Soln ophthalmic solution Generic drug: Travoprost (BAK Free) Place 1 drop into both eyes in the morning and at bedtime.         Time coordinating discharge: 35 min  Signed:  Joseph Art DO  [facility] 05/08/2019, 2:54 PM

## 2019-05-08 NOTE — Progress Notes (Signed)
Sylvan Cheese to be D/C'd home per MD order.  Discussed prescriptions and follow up appointments with the patient. Prescriptions sent to patient's preferred pharmacy, medication list explained in detail. Pt verbalized understanding.  Allergies as of 05/08/2019      Reactions   Demerol [meperidine] Hypertension   "Raised my blood pressure"   Talwin [pentazocine] Other (See Comments)   Caused hallucinations      Medication List    STOP taking these medications   verapamil 120 MG CR tablet Commonly known as: CALAN-SR     TAKE these medications   acetaminophen 500 MG tablet Commonly known as: TYLENOL Take 500-1,000 mg by mouth every 6 (six) hours as needed for mild pain or headache.   Alphagan P 0.15 % ophthalmic solution Generic drug: brimonidine Place 1 drop into both eyes daily.   ALPRAZolam 0.25 MG tablet Commonly known as: XANAX Take 0.25 mg by mouth daily as needed for anxiety.   amLODipine 5 MG tablet Commonly known as: NORVASC Take 1 tablet (5 mg total) by mouth daily. Start taking on: May 09, 2019   ascorbic acid 500 MG tablet Commonly known as: VITAMIN C Take 500-1,000 mg by mouth daily.   atorvastatin 20 MG tablet Commonly known as: LIPITOR Take 20 mg by mouth daily.   ferrous sulfate 325 (65 FE) MG tablet Take 325 mg by mouth daily with lunch.   furosemide 20 MG tablet Commonly known as: LASIX Take 20 mg by mouth daily as needed (for swelling of the ankles).   ipratropium-albuterol 0.5-2.5 (3) MG/3ML Soln Commonly known as: DUONEB Take 3 mLs by nebulization 2 (two) times daily.   lansoprazole 30 MG capsule Commonly known as: PREVACID Take 30 mg by mouth daily before breakfast.   levalbuterol 45 MCG/ACT inhaler Commonly known as: XOPENEX HFA Inhale 2 puffs into the lungs every 6 (six) hours as needed for shortness of breath.   meloxicam 15 MG tablet Commonly known as: MOBIC Take 15 mg by mouth daily as needed for pain.   metFORMIN 500 MG  tablet Commonly known as: GLUCOPHAGE Take 500 mg by mouth 2 (two) times daily.   metoprolol succinate 50 MG 24 hr tablet Commonly known as: TOPROL-XL Take 50 mg by mouth daily.   OXYGEN Inhale 2 L/min into the lungs continuous.   PRESCRIPTION MEDICATION CPAP- At bedtime and during any times of rest (WITH OXYGEN)   Travatan Z 0.004 % Soln ophthalmic solution Generic drug: Travoprost (BAK Free) Place 1 drop into both eyes in the morning and at bedtime.       Vitals:   05/08/19 0829 05/08/19 1234  BP: (!) 154/65 126/67  Pulse: 98 87  Resp: 18 18  Temp: 98.2 F (36.8 C) 98.1 F (36.7 C)  SpO2: 93% 94%    Skin clean, dry and intact without evidence of skin break down, no evidence of skin tears noted. IV catheter discontinued intact. Site without signs and symptoms of complications. Dressing and pressure applied. Pt denies pain at this time. No complaints noted.  An After Visit Summary was printed and given to the patient. Patient escorted via WC, and D/C home via private auto.  Johnette Abraham [facility] 05/08/2019 5:34 PM

## 2019-05-08 NOTE — Consult Note (Signed)
Cardiology Consultation:   Patient ID: Heather Aguilar MRN: [id number]; DOB: 1930/05/12  Admit date: 05/05/2019 Date of Consult: 05/08/2019  Primary Care Provider: Street, Heather Coup, MD Primary Cardiologist: Heather Si, MD  Primary Electrophysiologist:  None    Patient Profile:   Heather Aguilar is a 86 y.o. female with a hx of OSA on CPAP, DM2, COPD on 2L O2 at home, HTN, thyroid goiter, CKD stage 3, HLD who is being seen today for the evaluation of near syncope at the request of Dr. Benjamine Aguilar.  History of Present Illness:   Heather Aguilar has not been seen by cardiology in the past. No history of MI, stent, arrhythmia, stroke. Family history positive for MI in her mother in her 34s. She takes lasix as needed for swelling. She lives with her sister and is very functional. No tobacco, alcohol, drug use.   The patient presented to the ED for near syncope. 4 days ago she was sitting at the table when she felt palpitations, sob and lightheadedness. It was a sudden onset and she felt like she was going to pass out but didn't. She felt hot and had generalized strange feeling. She did not feel dizzy. No chest pain. Symptoms lasted 3 minutes. She continued to feel sob afterwards for a couple more minutes. Patient had a similar episode about a month ago. Her sister brought her to the ED for further evaluation. Ne recent illness, fever, chills, N/V, diarrhea.   In the ED BP 172/89, pulse 88, afebrile, RR 16. Patient was placed on 2L O2 (baseline). Labs showed glucose 123, WBC 11.3, Hgb 11.4. BNP 108. D-dimer 0.53. Hs troponin 5>4.  EKG showed NSR with no ischemic changes. CXR with chronic chronic increased interstitial lung markings, no acute process. CTA chest with no PE, emphysema, small consolidation in left lower lobe. She was given steroids and albuterol and admitted for further work-up.    Past Medical History:  Diagnosis Date  . COPD (chronic obstructive pulmonary disease) (HCC)    on home O2  .  Diabetes mellitus (HCC)   . Dyslipidemia   . Essential hypertension   . OSA (obstructive sleep apnea)    wears CPAP  . Pulmonary nodule   . Thyroid goiter     Past Surgical History:  Procedure Laterality Date  . ABDOMINAL HYSTERECTOMY    . COLON SURGERY       Home Medications:  Prior to Admission medications   Medication Sig Start Date End Date Taking? Authorizing Provider  acetaminophen (TYLENOL) 500 MG tablet Take 500-1,000 mg by mouth every 6 (six) hours as needed for mild pain or headache.   Yes [provider]  ALPHAGAN P 0.15 % ophthalmic solution Place 1 drop into both eyes daily. 01/01/19  Yes [provider]  ALPRAZolam Prudy Feeler) 0.25 MG tablet Take 0.25 mg by mouth daily as needed for anxiety. 05/01/19  Yes [provider]  ascorbic acid (VITAMIN C) 500 MG tablet Take 500-1,000 mg by mouth daily.    Yes [provider]  atorvastatin (LIPITOR) 20 MG tablet Take 20 mg by mouth daily. 05/01/19  Yes [provider]  ferrous sulfate 325 (65 FE) MG tablet Take 325 mg by mouth daily with lunch.   Yes [provider]  furosemide (LASIX) 20 MG tablet Take 20 mg by mouth daily as needed (for swelling of the ankles).  05/01/19  Yes [provider]  ipratropium-albuterol (DUONEB) 0.5-2.5 (3) MG/3ML SOLN Take 3 mLs by nebulization 2 (  two) times daily. 04/24/19  Yes [provider]  lansoprazole (PREVACID) 30 MG capsule Take 30 mg by mouth daily before breakfast. 05/01/19  Yes [provider]  levalbuterol (XOPENEX HFA) 45 MCG/ACT inhaler Inhale 2 puffs into the lungs every 6 (six) hours as needed for shortness of breath. 04/30/19  Yes [provider]  meloxicam (MOBIC) 15 MG tablet Take 15 mg by mouth daily as needed for pain. 04/21/19  Yes [provider]  metFORMIN (GLUCOPHAGE) 500 MG tablet Take 500 mg by mouth 2 (two) times daily. 05/01/19  Yes [provider]  metoprolol succinate (TOPROL-XL) 50  MG 24 hr tablet Take 50 mg by mouth daily. 05/01/19  Yes [provider]  OXYGEN Inhale 2 L/min into the lungs continuous.   Yes [provider]  PRESCRIPTION MEDICATION CPAP- At bedtime and during any times of rest (WITH OXYGEN)   Yes [provider]  TRAVATAN Z 0.004 % SOLN ophthalmic solution Place 1 drop into both eyes in the morning and at bedtime. 11/27/18  Yes [provider]  verapamil (CALAN-SR) 120 MG CR tablet Take 240 mg by mouth at bedtime. 05/01/19  Yes [provider]    Inpatient Medications: Scheduled Meds: . amLODipine  5 mg Oral Daily  . atorvastatin  20 mg Oral Daily  . brimonidine  1 drop Both Eyes Daily  . docusate sodium  100 mg Oral BID  . enoxaparin (LOVENOX) injection  40 mg Subcutaneous Q24H  . insulin aspart  0-15 Units Subcutaneous TID WC  . ipratropium-albuterol  3 mL Nebulization BID  . latanoprost  1 drop Both Eyes BID  . metoprolol succinate  50 mg Oral Daily  . pantoprazole  20 mg Oral Daily  . sodium chloride flush  3 mL Intravenous Q12H   Continuous Infusions:  PRN Meds: acetaminophen **OR** acetaminophen, ALPRAZolam, bisacodyl, hydrALAZINE, HYDROcodone-acetaminophen, levalbuterol, ondansetron **OR** ondansetron (ZOFRAN) IV, polyethylene glycol, zolpidem  Allergies:    Allergies  Allergen Reactions  . Demerol [Meperidine] Hypertension    "Raised my blood pressure"  . Talwin [Pentazocine] Other (See Comments)    Caused hallucinations     Social History:   Social History   Socioeconomic History  . Marital status: Widowed    Spouse name: Not on file  . Number of children: Not on file  . Years of education: Not on file  . Highest education level: Not on file  Occupational History  . Occupation: retired  Tobacco Use  . Smoking status: Former Smoker    Packs/day: 0.75    Years: 40.00    Pack years: 30.00    Quit date: 1980    Years since quitting: 41.3  . Smokeless tobacco: Never Used    Substance and Sexual Activity  . Alcohol use: Not Currently    Comment: socially remotely  . Drug use: Never  . Sexual activity: Not on file  Other Topics Concern  . Not on file  Social History Narrative  . Not on file   Social Determinants of Health   Financial Resource Strain:   . Difficulty of Paying Living Expenses:   Food Insecurity:   . Worried About Programme researcher, broadcasting/film/videounning Out of Food in the Last Year:   . Baristaan Out of Food in the Last Year:   Transportation Needs:   . Freight forwarderLack of Transportation (Medical):   Marland Kitchen. Lack of Transportation (Non-Medical):   Physical Activity:   . Days of Exercise per Week:   . Minutes of Exercise per Session:  Stress:   . Feeling of Stress :   Social Connections:   . Frequency of Communication with Friends and Family:   . Frequency of Social Gatherings with Friends and Family:   . Attends Religious Services:   . Active Member of Clubs or Organizations:   . Attends Banker Meetings:   Marland Kitchen Marital Status:   Intimate Partner Violence:   . Fear of Current or Ex-Partner:   . Emotionally Abused:   Marland Kitchen Physically Abused:   . Sexually Abused:     Family History:   Family History  Problem Relation Age of Onset  . Heart attack Mother      ROS:  Please see the history of present illness.  All other ROS reviewed and negative.     Physical Exam/Data:   Vitals:   05/08/19 0610 05/08/19 0750 05/08/19 0829 05/08/19 1234  BP: 129/71  (!) 154/65 126/67  Pulse: 78  98 87  Resp: 18  18 18   Temp: 97.9 F (36.6 C)  98.2 F (36.8 C) 98.1 F (36.7 C)  TempSrc: Oral  Oral Oral  SpO2: 95% 90% 93% 94%  Weight:      Height:        Intake/Output Summary (Last 24 hours) at 05/08/2019 1405 Last data filed at 05/08/2019 1339 Gross per 24 hour  Intake 303 ml  Output 1200 ml  Net -897 ml   Last 3 Weights 05/07/2019 05/06/2019  Weight (lbs) 155 lb 11.2 oz 193 lb  Weight (kg) 70.625 kg 87.544 kg     Body mass index is 25.91 kg/m.  General:  Well nourished,  well developed, in no acute distress HEENT: normal Lymph: no adenopathy Neck: no JVD Endocrine:  No thryomegaly Vascular: No carotid bruits; FA pulses 2+ bilaterally without bruits  Cardiac:  normal S1, S2; RRR; no murmur  Lungs:  clear to auscultation bilaterally, no wheezing, rhonchi or rales; 2L O2 Abd: soft, nontender, no hepatomegaly  Ext: no edema Musculoskeletal:  No deformities, BUE and BLE strength normal and equal Skin: warm and dry  Neuro:  CNs 2-12 intact, no focal abnormalities noted Psych:  Normal affect   EKG:  The EKG was personally reviewed and demonstrates:  NSR, 85 bpm, no ischemic changes Telemetry:  Telemetry was personally reviewed and demonstrates:  NSR, HR   Relevant CV Studies:  Echo 05/06/19 1. Left ventricular ejection fraction, by estimation, is 65 to 70%. The  left ventricle has normal function. The left ventricle has no regional  wall motion abnormalities. There is moderate left ventricular hypertrophy  of the basal-septal segment. Left  ventricular diastolic parameters are consistent with Grade I diastolic  dysfunction (impaired relaxation).  2. Right ventricular systolic function is mildly reduced. The right  ventricular size is normal. Tricuspid regurgitation signal is inadequate  for assessing PA pressure.  3. The mitral valve is normal in structure. Mild mitral valve  regurgitation. No evidence of mitral stenosis.  4. The aortic valve is tricuspid. Aortic valve regurgitation is not  visualized. Mild aortic valve sclerosis is present, with no evidence of  aortic valve stenosis.   Laboratory Data:  High Sensitivity Troponin:   Recent Labs  Lab 05/06/19 0304 05/06/19 0525  TROPONINIHS 5 4     Chemistry Recent Labs  Lab 05/05/19 2225 05/07/19 0121 05/08/19 0847  NA 143 141 141  K 4.3 4.6 3.6  CL 107 106 102  CO2 27 26 29   GLUCOSE 123* 111* 171*  BUN 17 20 20  CREATININE 1.00 0.83 0.95  CALCIUM 9.4 9.7 9.3  GFRNONAA 51* >60  54*  GFRAA 59* >60 >60  ANIONGAP 9 9 10     No results for input(s): PROT, ALBUMIN, AST, ALT, ALKPHOS, BILITOT in the last 168 hours. Hematology Recent Labs  Lab 05/05/19 2225 05/07/19 0121  WBC 11.3* 14.1*  RBC 4.59 4.67  HGB 11.4* 11.5*  HCT 37.8 37.8  MCV 82.4 80.9  MCH 24.8* 24.6*  MCHC 30.2 30.4  RDW 16.4* 16.1*  PLT 222 207   BNP Recent Labs  Lab 05/05/19 2225  BNP 108.1*    DDimer  Recent Labs  Lab 05/05/19 2225  DDIMER 0.53*     Radiology/Studies:  DG Chest 2 View  Result Date: 05/05/2019 CLINICAL DATA:  Shortness of breath. EXAM: CHEST - 2 VIEW COMPARISON:  March 26, 2017 FINDINGS: Mild to moderate severity diffuse chronic appearing increased interstitial lung markings are seen. There is no evidence of acute infiltrate, pleural effusion or pneumothorax. The heart size and mediastinal contours are within normal limits. The visualized skeletal structures are unremarkable. IMPRESSION: Chronic-appearing increased interstitial lung markings without evidence of acute or active cardiopulmonary disease. Electronically Signed   By: March 28, 2017 M.D.   On: 05/05/2019 23:33   CT Angio Chest PE W and/or Wo Contrast  Result Date: 05/06/2019 CLINICAL DATA:  Increased short of breath. EXAM: CT ANGIOGRAPHY CHEST WITH CONTRAST TECHNIQUE: Multidetector CT imaging of the chest was performed using the standard protocol during bolus administration of intravenous contrast. Multiplanar CT image reconstructions and MIPs were obtained to evaluate the vascular anatomy. CONTRAST:  07/06/2019 OMNIPAQUE IOHEXOL 350 MG/ML SOLN COMPARISON:  None. FINDINGS: Cardiovascular: No filling defects within the pulmonary arteries to suggest acute pulmonary embolism. Mediastinum/Nodes: No axillary adenopathy. Bulky LEFT supraclavicular mass measuring 4.1 cm (image 1/5) extends into the thoracic inlet. This appears to originate from the thyroid gland. ). Nodular component extends posterior to the trachea above  the carina measuring 2.2 cm. exam. Findings similar to CT 12/06/2012 Lungs/Pleura: Extensive centrilobular emphysema the upper lobes. No suspicious nodularity. Airspace disease. No pulmonary infarction. Small focus consolidation LEFT lower lobe measuring 2.3 cm is increased from 1.1 cm on remote comparison CT. Limited view of the liver, kidneys, pancreas are unremarkable. Normal adrenal glands. Upper Abdomen: Limited view of the liver, kidneys, pancreas are unremarkable. Normal adrenal glands. Musculoskeletal: No aggressive osseous lesion. Review of the MIP images confirms the above findings. IMPRESSION: 1. No evidence acute pulmonary embolism. 2. Extensive centrilobular emphysema. 3. Small focus consolidation in the LEFT lower lobe is increased in size from CT 2014. Favor benign finding. Consider follow-up chest CT in 3 to 6 months. 4. 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 Electronically Signed   By: 2015 M.D.   On: 05/06/2019 05:28   ECHOCARDIOGRAM COMPLETE  Result Date: 05/06/2019    ECHOCARDIOGRAM REPORT   Patient Name:   Heather Aguilar Miceli Date of Exam: 05/06/2019 Medical Rec #:  07/06/2019    Height: Accession #:    [id number]   Weight: Date of Birth:  Mar 26, 1930    BSA: Patient Age:    88 years     BP:           141/83 mmHg Patient Gender: F            HR:           91 bpm. Exam Location:  Inpatient Procedure: 2D Echo, Cardiac Doppler and Color Doppler Indications:  Syncope  History:        Patient has no prior history of Echocardiogram examinations.                 COPD, Signs/Symptoms:Shortness of Breath; Risk Factors:Diabetes,                 Hypertension, Dyslipidemia and Sleep Apnea.  Sonographer:    Lavenia Atlas Referring Phys: [id number] JENNIFER YATES IMPRESSIONS  1. Left ventricular ejection fraction, by estimation, is 65 to 70%. The left ventricle has normal function. The left ventricle has no regional wall motion abnormalities. There is  moderate left ventricular hypertrophy of the basal-septal segment. Left ventricular diastolic parameters are consistent with Grade I diastolic dysfunction (impaired relaxation).  2. Right ventricular systolic function is mildly reduced. The right ventricular size is normal. Tricuspid regurgitation signal is inadequate for assessing PA pressure.  3. The mitral valve is normal in structure. Mild mitral valve regurgitation. No evidence of mitral stenosis.  4. The aortic valve is tricuspid. Aortic valve regurgitation is not visualized. Mild aortic valve sclerosis is present, with no evidence of aortic valve stenosis. FINDINGS  Left Ventricle: Left ventricular ejection fraction, by estimation, is 65 to 70%. The left ventricle has normal function. The left ventricle has no regional wall motion abnormalities. The left ventricular internal cavity size was normal in size. There is  moderate left ventricular hypertrophy of the basal-septal segment. Left ventricular diastolic parameters are consistent with Grade I diastolic dysfunction (impaired relaxation). Normal left ventricular filling pressure. Right Ventricle: The right ventricular size is normal. No increase in right ventricular wall thickness. Right ventricular systolic function is mildly reduced. Tricuspid regurgitation signal is inadequate for assessing PA pressure. Left Atrium: Left atrial size was normal in size. Right Atrium: Right atrial size was normal in size. Pericardium: There is no evidence of pericardial effusion. Mitral Valve: The mitral valve is normal in structure. Normal mobility of the mitral valve leaflets. Mild mitral valve regurgitation. No evidence of mitral valve stenosis. Tricuspid Valve: The tricuspid valve is normal in structure. Tricuspid valve regurgitation is not demonstrated. No evidence of tricuspid stenosis. Aortic Valve: The aortic valve is tricuspid. Aortic valve regurgitation is not visualized. Mild aortic valve sclerosis is present,  with no evidence of aortic valve stenosis. Pulmonic Valve: The pulmonic valve was normal in structure. Pulmonic valve regurgitation is trivial. No evidence of pulmonic stenosis. Aorta: The aortic root is normal in size and structure. Venous: The inferior vena cava was not well visualized. IAS/Shunts: No atrial level shunt detected by color flow Doppler.  LEFT VENTRICLE PLAX 2D LVIDd:         3.73 cm  Diastology LVIDs:         2.67 cm  LV e' lateral:   5.44 cm/s LV PW:         1.20 cm  LV E/e' lateral: 10.3 LV IVS:        1.30 cm  LV e' medial:    4.57 cm/s LVOT diam:     1.80 cm  LV E/e' medial:  12.3 LV SV:         48 LVOT Area:     2.54 cm  RIGHT VENTRICLE RV Basal diam:  2.24 cm RV S prime:     18.60 cm/s TAPSE (M-mode): 1.6 cm LEFT ATRIUM             RIGHT ATRIUM LA diam:        4.00 cm RA Area:  8.06 cm LA Vol (A2C):   23.5 ml RA Volume:   13.60 ml LA Vol (A4C):   29.8 ml LA Biplane Vol: 27.7 ml  AORTIC VALVE LVOT Vmax:   96.80 cm/s LVOT Vmean:  61.800 cm/s LVOT VTI:    0.189 m  AORTA Ao Root diam: 2.70 cm MITRAL VALVE MV Area (PHT): 7.37 cm    SHUNTS MV Decel Time: 103 msec    Systemic VTI:  0.19 m MV E velocity: 56.30 cm/s  Systemic Diam: 1.80 cm MV A velocity: 74.70 cm/s MV E/A ratio:  0.75 Armanda Magic MD Electronically signed by Armanda Magic MD Signature Date/Time: 05/06/2019/2:16:48 PM    Final    VAS US CAROTID  Result Date: 05/08/2019 Carotid Arterial Duplex Study Indications: Syncope. Limitations  Today's exam was limited due to the body habitus of the patient and              patient anatomy. Performing Technologist: Gertie Fey MHA, RDMS, RVT, RDCS  Examination Guidelines: A complete evaluation includes B-mode imaging, spectral Doppler, color Doppler, and power Doppler as needed of all accessible portions of each vessel. Bilateral testing is considered an integral part of a complete examination. Limited examinations for reoccurring indications may be performed as noted.  Right Carotid  Findings: +----------+--------+--------+--------+--------------------------+--------+           PSV cm/sEDV cm/sStenosisPlaque Description        Comments +----------+--------+--------+--------+--------------------------+--------+ CCA Prox  73      12                                                 +----------+--------+--------+--------+--------------------------+--------+ CCA Distal56      15                                                 +----------+--------+--------+--------+--------------------------+--------+ ICA Prox  46      13                                                 +----------+--------+--------+--------+--------------------------+--------+ ICA Distal86      23                                                 +----------+--------+--------+--------+--------------------------+--------+ ECA       49      8               irregular and heterogenous         +----------+--------+--------+--------+--------------------------+--------+ +----------+--------+-------+----------------+-------------------+           PSV cm/sEDV cmsDescribe        Arm Pressure (mmHG) +----------+--------+-------+----------------+-------------------+ [id number]            Multiphasic, WNL                    +----------+--------+-------+----------------+-------------------+ +---------+--------+--+--------+--+---------+ VertebralPSV cm/s72EDV cm/s12Antegrade +---------+--------+--+--------+--+---------+  Left Carotid Findings: +----------+--------+--------+--------+------------------+--------+           PSV cm/sEDV cm/sStenosisPlaque DescriptionComments +----------+--------+--------+--------+------------------+--------+  CCA Prox  65      15                                         +----------+--------+--------+--------+------------------+--------+ CCA Distal65      16                                          +----------+--------+--------+--------+------------------+--------+ ICA Prox  74      15                                         +----------+--------+--------+--------+------------------+--------+ ICA Distal69      24                                         +----------+--------+--------+--------+------------------+--------+ ECA       97      8                                          +----------+--------+--------+--------+------------------+--------+ +----------+--------+--------+----------------+-------------------+           PSV cm/sEDV cm/sDescribe        Arm Pressure (mmHG) +----------+--------+--------+----------------+-------------------+ [id number]              Multiphasic, WNL                    +----------+--------+--------+----------------+-------------------+ +---------+--------+--+--------+--+---------+ VertebralPSV cm/s65EDV cm/s13Antegrade +---------+--------+--+--------+--+---------+   Summary: Right Carotid: Velocities in the right ICA are consistent with a 1-39% stenosis. Left Carotid: Velocities in the left ICA are consistent with a 1-39% stenosis. Vertebrals:  Bilateral vertebral arteries demonstrate antegrade flow. Subclavians: Normal flow hemodynamics were seen in bilateral subclavian              arteries. Incidental finding: the left thyroid has internal vascularity and multiple heterogenous areas; etiology unknown. Further imaging may be warranted if clinically indicated. *See table(s) above for measurements and observations.  Electronically signed by Delia Heady MD on 05/08/2019 at 11:38:16 AM.    Final    {  Assessment and Plan:   Near syncope Patient felt palpitations, hot, and had sob for a couple minutes while at rest. Symptoms resolved on there own.  - unclear etiology - Patient has COPD on 2L O2 at baseline. Imaging showed chronic changes, nothing acute - HS trop negative x 2. EKG nonischemic - D-dimer negative.  - orthostatic  vitals normal - Echo showed EF 65-70%, no WMA, mod LVH, G1DD, mildly reduced RV function, mild MR - carotid dopplers showed normal flow - TSH 0.024 - HR noted to drop to the 30s overnight and verapamil was d/c'd. Last night rates were better. - given palpitations would consider heart monitor on discharge to monitor for arrhythmias.   Chronic respiratory failure/COPD on home O2 - patient has chronic sob - she has not required more supplemental O2 - per IM  Chronic diastolic heart failure - patient takes lasix as needed for swelling - continue BB - patient appears compensated  OSA on CPAP -  continue CPAP  HTN - Toprol XL at baseline - Verapamil discontinued due to HR in the 30s overnight - amlodipine 5mg  daily started  HLD - continue Lipitor - no recent labs on file  DM2 - A1C 6.1 - SSI   For questions or updates, please contact [facility] Please consult [email or web address] for contact info under     Signed, Violette Morneault Ninfa Meeker, PA-C  05/08/2019 2:05 PM

## 2019-05-08 NOTE — TOC Initial Note (Signed)
Transition of Care [facility]) - Initial/Assessment Note    Patient Details  Name: Heather Aguilar MRN: [id number] Date of Birth: 1930-08-29  Transition of Care [facility]) CM/SW Contact:    Kingsley Plan, RN Phone Number: 05/08/2019, 3:19 PM  Clinical Narrative:                 Patient from home with sister. Has home oxygen with [employer] . Patient has portable tank and sister will provide transport home.   Discussed with patient PT recommendations HHPT. Patient declined does not feel that she needs at this time. If she changes her mind she will call PCP.  Expected Discharge Plan: Home/Self Care Barriers to Discharge: No Barriers Identified   Patient Goals and CMS Choice Patient states their goals for this hospitalization and ongoing recovery are:: to go home CMS Medicare.gov Compare Post Acute Care list provided to:: Patient Choice offered to / list presented to : Patient  Expected Discharge Plan and Services Expected Discharge Plan: Home/Self Care   Discharge Planning Services: CM Consult   Living arrangements for the past 2 months: Single Family Home Expected Discharge Date: 05/08/19               DME Arranged: N/A DME Agency: NA       HH Arranged: Patient Refused HH          Prior Living Arrangements/Services Living arrangements for the past 2 months: Single Family Home Lives with:: Siblings Patient language and need for interpreter reviewed:: Yes Do you feel safe going back to the place where you live?: Yes      Need for Family Participation in Patient Care: Yes (Comment) Care giver support system in place?: Yes (comment) Current home services: DME Criminal Activity/Legal Involvement Pertinent to Current Situation/Hospitalization: No - Comment as needed  Activities of Daily Living Home Assistive Devices/Equipment: None ADL Screening (condition at time of admission) Patient's cognitive ability adequate to safely complete daily activities?: Yes Is the  patient deaf or have difficulty hearing?: No Does the patient have difficulty seeing, even when wearing glasses/contacts?: No Does the patient have difficulty concentrating, remembering, or making decisions?: No Patient able to express need for assistance with ADLs?: Yes Does the patient have difficulty dressing or bathing?: No Independently performs ADLs?: Yes (appropriate for developmental age) Does the patient have difficulty walking or climbing stairs?: Yes Weakness of Legs: Both Weakness of Arms/Hands: None  Permission Sought/Granted   Permission granted to share information with : No              Emotional Assessment Appearance:: Appears stated age Attitude/Demeanor/Rapport: Engaged Affect (typically observed): Accepting Orientation: : Oriented to Self, Oriented to Place, Oriented to  Time, Oriented to Situation Alcohol / Substance Use: Not Applicable Psych Involvement: No (comment)  Admission diagnosis:  COPD exacerbation (HCC) [J44.1] Near syncope [R55] Patient Active Problem List   Diagnosis Date Noted  . Near syncope 05/06/2019  . Chronic respiratory failure with hypoxia (HCC) 05/06/2019  . Stage 3a chronic kidney disease 05/06/2019  . Thyroid goiter   . Pulmonary nodule   . OSA (obstructive sleep apnea)   . Essential hypertension   . Dyslipidemia   . Diabetes mellitus (HCC)   . COPD (chronic obstructive pulmonary disease) (HCC)    PCP:  Street, [name], MD Pharmacy:   [facility] [address] [place] - [address] [place] [address] Phone: [phone] Fax: [phone]     Social Determinants of  Health (SDOH) Interventions    Readmission Risk Interventions No flowsheet data found.

## 2019-05-08 NOTE — Telephone Encounter (Signed)
Per Dr Duke Salvia patient needs new patient appointment

## 2019-05-09 ENCOUNTER — Encounter: Payer: Self-pay | Admitting: *Deleted

## 2019-05-09 ENCOUNTER — Telehealth: Payer: Self-pay | Admitting: *Deleted

## 2019-05-09 NOTE — Telephone Encounter (Signed)
Discussed with Dr Duke Salvia and ok for patient to be seen end of next week  Patient has been scheduled for 05/18/2019. Aware of date, time, and location

## 2019-05-09 NOTE — Telephone Encounter (Signed)
[employer] to ship a 30 day cardiac event monitor to her home.  Instructions reviewed briefly and will be mailed to the patients home.

## 2019-05-12 ENCOUNTER — Ambulatory Visit (INDEPENDENT_AMBULATORY_CARE_PROVIDER_SITE_OTHER): Payer: Medicare Other

## 2019-05-12 ENCOUNTER — Encounter: Payer: Self-pay | Admitting: Cardiovascular Disease

## 2019-05-12 DIAGNOSIS — R002 Palpitations: Secondary | ICD-10-CM | POA: Diagnosis not present

## 2019-05-14 DIAGNOSIS — I110 Hypertensive heart disease with heart failure: Secondary | ICD-10-CM | POA: Diagnosis not present

## 2019-05-14 DIAGNOSIS — I10 Essential (primary) hypertension: Secondary | ICD-10-CM | POA: Diagnosis not present

## 2019-05-14 DIAGNOSIS — I5030 Unspecified diastolic (congestive) heart failure: Secondary | ICD-10-CM | POA: Diagnosis not present

## 2019-05-14 DIAGNOSIS — R55 Syncope and collapse: Secondary | ICD-10-CM | POA: Diagnosis not present

## 2019-05-18 ENCOUNTER — Encounter: Payer: Self-pay | Admitting: Cardiovascular Disease

## 2019-05-18 ENCOUNTER — Ambulatory Visit (INDEPENDENT_AMBULATORY_CARE_PROVIDER_SITE_OTHER): Payer: Medicare Other | Admitting: Cardiovascular Disease

## 2019-05-18 ENCOUNTER — Other Ambulatory Visit: Payer: Self-pay

## 2019-05-18 VITALS — BP 130/64 | HR 100 | Ht 63.0 in | Wt 157.2 lb

## 2019-05-18 DIAGNOSIS — E785 Hyperlipidemia, unspecified: Secondary | ICD-10-CM

## 2019-05-18 DIAGNOSIS — R0789 Other chest pain: Secondary | ICD-10-CM

## 2019-05-18 DIAGNOSIS — I10 Essential (primary) hypertension: Secondary | ICD-10-CM | POA: Diagnosis not present

## 2019-05-18 DIAGNOSIS — G4733 Obstructive sleep apnea (adult) (pediatric): Secondary | ICD-10-CM | POA: Diagnosis not present

## 2019-05-18 NOTE — Progress Notes (Signed)
Cardiology Office Note   Date:  05/18/2019   ID:  Heather Aguilar, Heather Aguilar [birth year], MRN [id number]  PCP:  Street, Heather Coup, MD  Cardiologist:   Chilton Si, MD   No chief complaint on file.    History of Present Illness: Heather Aguilar is a 86 y.o. female with OSA, hypertension, diabetes, GERD, hyperlipidemia, CKD III, and COPD who is being seen today for the evaluation of near syncope at the request of Street, Heather Aguilar, *.   Heather Aguilar had an episode of near syncope that occurred while sitting at the table.  Ten minutes prior to the episode she was in the restroom straining with constipation.  she was sitting at the table when She started feeling hot and like she was going to pass out. There was no associated chest pain or palpitations but she did feel short of breath.  The episode lasted for several minutes so she decided to go the ED.  By the time she reached the ED her lightheadedness improved but she still remained short of breath.  She was seen in the ED on 4/12 where orthostatic vitals were negative.  Echo revealed LVEF 65-70% with grade 1 diastolic dysfunction.  While there her heart rate was in the 30s so verapamil was discontinued. Amlodipine was added to her regimen.  She reported tinnitus and had a carotid Doppler that revealed mild stenosis bilaterally.  The left thryoud showed internal vascularity and heterogeneous areas of uncertain significance.  She reports knowing that she has a thyroid nodule and this has been followed as an outpatient.  An ambulatory monitor was placed.  She wore it for a few days but had difficulty with skin irritation from the stickers.  She has not had any recurrent episodes of near syncope.  Heather Aguilar denies any orthostatic lightheadedness.  She typically drinks 2 waters and 4 decaffeinated coffees daily.  She does not get much exercise.  She notes that when she stands for more than 10 or 15 minutes she starts feeling like there is a weight around her  chest and shoulders.  This is associated with an increase in her shortness of breath.  She denies any chest pain or pressure.  She uses a CPAP and oxygen at night.  She denies lower extremity edema.  She wears compression socks regularly.   Past Medical History:  Diagnosis Date  . COPD (chronic obstructive pulmonary disease) (HCC)    on home O2  . Diabetes mellitus (HCC)   . Dyslipidemia   . Essential hypertension   . OSA (obstructive sleep apnea)    wears CPAP  . Pulmonary nodule   . Thyroid goiter     Past Surgical History:  Procedure Laterality Date  . ABDOMINAL HYSTERECTOMY    . COLON SURGERY       Current Outpatient Medications  Medication Sig Dispense Refill  . acetaminophen (TYLENOL) 500 MG tablet Take 500-1,000 mg by mouth every 6 (six) hours as needed for mild pain or headache.    . ALPHAGAN P 0.15 % ophthalmic solution Place 1 drop into both eyes daily.    Marland Kitchen ALPRAZolam (XANAX) 0.25 MG tablet Take 0.25 mg by mouth daily as needed for anxiety.    Marland Kitchen amLODipine (NORVASC) 5 MG tablet Take 1 tablet (5 mg total) by mouth daily. 30 tablet 0  . ascorbic acid (VITAMIN C) 500 MG tablet Take 500-1,000 mg by mouth daily.     Marland Kitchen atorvastatin (LIPITOR) 20 MG tablet Take  20 mg by mouth daily.    . ferrous sulfate 325 (65 FE) MG tablet Take 325 mg by mouth daily with lunch.    . furosemide (LASIX) 20 MG tablet Take 20 mg by mouth daily as needed (for swelling of the ankles).     Marland Kitchen ipratropium-albuterol (DUONEB) 0.5-2.5 (3) MG/3ML SOLN Take 3 mLs by nebulization 2 (two) times daily.    . lansoprazole (PREVACID) 30 MG capsule Take 30 mg by mouth daily before breakfast.    . levalbuterol (XOPENEX HFA) 45 MCG/ACT inhaler Inhale 2 puffs into the lungs every 6 (six) hours as needed for shortness of breath.    . meloxicam (MOBIC) 15 MG tablet Take 15 mg by mouth daily as needed for pain.    . metFORMIN (GLUCOPHAGE) 500 MG tablet Take 500 mg by mouth 2 (two) times daily.    . metoprolol  succinate (TOPROL-XL) 50 MG 24 hr tablet Take 50 mg by mouth daily.    . OXYGEN Inhale 2 L/min into the lungs continuous.    Marland Kitchen PRESCRIPTION MEDICATION CPAP- At bedtime and during any times of rest (WITH OXYGEN)    . TRAVATAN Z 0.004 % SOLN ophthalmic solution Place 1 drop into both eyes in the morning and at bedtime.     No current facility-administered medications for this visit.    Allergies:   Demerol [meperidine] and Talwin [pentazocine]    Social History:  The patient  reports that she quit smoking about 41 years ago. She has a 30.00 pack-year smoking history. She has never used smokeless tobacco. She reports previous alcohol use. She reports that she does not use drugs.   Family History:  The patient's family history includes Breast cancer in her sister; Diabetes in her brother, maternal aunt, and sister; Heart attack in her father and mother; Lung cancer in her brother; Stroke in her daughter and sister.    ROS:  Please see the history of present illness.   Otherwise, review of systems are positive for none.   All other systems are reviewed and negative.    PHYSICAL EXAM: VS:  BP 130/64   Pulse 100   Ht 5\' 3"  (1.6 m)   Wt 157 lb 3.2 oz (71.3 kg)   LMP  (LMP Unknown)   SpO2 95%   BMI 27.85 kg/m  , BMI Body mass index is 27.85 kg/m. GENERAL:  Well appearing HEENT:  Pupils equal round and reactive, fundi not visualized, oral mucosa unremarkable NECK:  No jugular venous distention, waveform within normal limits, carotid upstroke brisk and symmetric, no bruits, +thyromegaly LUNGS:  Diminished breath sounds.  Clear to auscultation bilaterally HEART:  RRR.  PMI not displaced or sustained,S1 and S2 within normal limits, no S3, no S4, no clicks, no rubs, no murmurs ABD:  Flat, positive bowel sounds normal in frequency in pitch, no bruits, no rebound, no guarding, no midline pulsatile mass, no hepatomegaly, no splenomegaly EXT:  2 plus pulses throughout, no LE edema, no cyanosis no  clubbing SKIN:  No rashes no nodules NEURO:  Cranial nerves II through XII grossly intact, motor grossly intact throughout PSYCH:  Cognitively intact, oriented to person place and time  EKG:  EKG is not ordered today. The ekg ordered 05/05/19 demonstrates Sinus rhythm.  Rate 85 bpm   Recent Labs: 05/05/2019: B Natriuretic Peptide 108.1 05/06/2019: TSH 0.024 05/07/2019: Hemoglobin 11.5; Platelets 207 05/08/2019: BUN 20; Creatinine, Ser 0.95; Magnesium 1.8; Potassium 3.6; Sodium 141    Lipid Panel No results  found for: CHOL, TRIG, HDL, CHOLHDL, VLDL, LDLCALC, LDLDIRECT    Wt Readings from Last 3 Encounters:  05/18/19 157 lb 3.2 oz (71.3 kg)  05/07/19 155 lb 11.2 oz (70.6 kg)      ASSESSMENT AND PLAN:  # Near syncope:  It is unclear exactly what caused Ms. Hills near syncope.  The time she arrived in the emergency department she was not bradycardic.  Reportedly there was some bradycardia into the 30s overnight that was noted on telemetry.  However I believe this was occurring in the setting of her not having her CPAP machine.  Verapamil was discontinued in the hospital.  The near syncope episode did occur after straining to have a bowel movement.  It is quite possible that this could have been neurocardiogenic.  She was eating and drinking well and there was no orthostasis.  She is wearing an ambulatory monitor.  She will receive new stickers because her skin is sensitive and it is not sticking well.  Complete the 30-day ambulatory monitor.  Labs have been unremarkable.  Echo did show mild LVH, but there is no evidence of outflow tract obstruction and I do not hear a murmur concerning for HOCM on exam.  EKG not consistent with HCM.  Recommended that she increase her fluid intake, continue wearing compression stockings, and we will continue to follow.    # Chest tightness:  She has chest tightness when standing for long periods of time.  We will get a Lexiscan Myoview to evaluate for ischemia.   She does have COPD there is no evidence of wheezing on exam and she has not bradycardic or have any high degree heart block.  Therefore she should be okay to get Lexiscan.  # Essential hypertension: Blood pressure stable after stopping verapamil.  Continue metoprolol, amlodipine, and furosemide  # Hyperlipidemia:  Continue statin.  Current medicines are reviewed at length with the patient today.  The patient does not have concerns regarding medicines.  The following changes have been made:  no change  Labs/ tests ordered today include:  Orders Placed This Encounter  Procedures  . MYOCARDIAL PERFUSION IMAGING     Disposition:   FU with Sukhdeep Wieting C. Oval Linsey, MD, [facility] in 6-8 weeks.    Signed, Sisto Granillo C. Oval Linsey, MD, [facility]  05/18/2019 6:16 PM    [facility]

## 2019-05-18 NOTE — Patient Instructions (Signed)
Medication Instructions:  Your physician recommends that you continue on your current medications as directed. Please refer to the Current Medication list given to you today.  *If you need a refill on your cardiac medications before your next appointment, please call your pharmacy*  Lab Work: NONE If you have labs (blood work) drawn today and your tests are completely normal, you will receive your results only by: Marland Kitchen MyChart Message (if you have MyChart) OR . A paper copy in the mail If you have any lab test that is abnormal or we need to change your treatment, we will call you to review the results.  Testing/Procedures: Your physician has requested that you have a lexiscan myoview. For further information please visit [email or web address] Please follow instruction sheet, as given.  Follow-Up: At [facility], you and your health needs are our priority.  As part of our continuing mission to provide you with exceptional heart care, we have created designated Provider Care Teams.  These Care Teams include your primary Cardiologist (physician) and Advanced Practice Providers (APPs -  Physician Assistants and Nurse Practitioners) who all work together to provide you with the care you need, when you need it.  We recommend signing up for the patient portal called "MyChart".  Sign up information is provided on this After Visit Summary.  MyChart is used to connect with patients for Virtual Visits (Telemedicine).  Patients are able to view lab/test results, encounter notes, upcoming appointments, etc.  Non-urgent messages can be sent to your provider as well.   To learn more about what you can do with MyChart, go to [email or web address].    Your next appointment:   6-8 week(s)  The format for your next appointment:   In Person  Provider:   You may see Chilton Si, MD or one of the following Advanced Practice Providers on your designated Care Team:    Corine Shelter, PA-C  [place], [place]  Edd Fabian, [place]  Other Instructions  MAKE SURE YOU ARE DRINKING AT LEAST 64 OUNCES   Cardiac Nuclear Scan A cardiac nuclear scan is a test that is done to check the flow of blood to your heart. It is done when you are resting and when you are exercising. The test looks for problems such as:  Not enough blood reaching a portion of the heart.  The heart muscle not working as it should. You may need this test if:  You have heart disease.  You have had lab results that are not normal.  You have had heart surgery or a balloon procedure to open up blocked arteries (angioplasty).  You have chest pain.  You have shortness of breath. In this test, a special dye (tracer) is put into your bloodstream. The tracer will travel to your heart. A camera will then take pictures of your heart to see how the tracer moves through your heart. This test is usually done at a hospital and takes 2-4 hours. Tell a doctor about:  Any allergies you have.  All medicines you are taking, including vitamins, herbs, eye drops, creams, and over-the-counter medicines.  Any problems you or family members have had with anesthetic medicines.  Any blood disorders you have.  Any surgeries you have had.  Any medical conditions you have.  Whether you are pregnant or may be pregnant. What are the risks? Generally, this is a safe test. However, problems may occur, such as:  Serious chest pain and heart attack. This is only a risk if the  stress portion of the test is done.  Rapid heartbeat.  A feeling of warmth in your chest. This feeling usually does not last long.  Allergic reaction to the tracer. What happens before the test?  Ask your doctor about changing or stopping your normal medicines. This is important.  Follow instructions from your doctor about what you cannot eat or drink.  Remove your jewelry on the day of the test. What happens during the test?  An IV tube will be  inserted into one of your veins.  Your doctor will give you a small amount of tracer through the IV tube.  You will wait for 20-40 minutes while the tracer moves through your bloodstream.  Your heart will be monitored with an electrocardiogram (ECG).  You will lie down on an exam table.  Pictures of your heart will be taken for about 15-20 minutes.  You may also have a stress test. For this test, one of these things may be done: ? You will be asked to exercise on a treadmill or a stationary bike. ? You will be given medicines that will make your heart work harder. This is done if you are unable to exercise.  When blood flow to your heart has peaked, a tracer will again be given through the IV tube.  After 20-40 minutes, you will get back on the exam table. More pictures will be taken of your heart.  Depending on the tracer that is used, more pictures may need to be taken 3-4 hours later.  Your IV tube will be removed when the test is over. The test may vary among doctors and hospitals. What happens after the test?  Ask your doctor: ? Whether you can return to your normal schedule, including diet, activities, and medicines. ? Whether you should drink more fluids. This will help to remove the tracer from your body. Drink enough fluid to keep your pee (urine) pale yellow.  Ask your doctor, or the department that is doing the test: ? When will my results be ready? ? How will I get my results? Summary  A cardiac nuclear scan is a test that is done to check the flow of blood to your heart.  Tell your doctor whether you are pregnant or may be pregnant.  Before the test, ask your doctor about changing or stopping your normal medicines. This is important.  Ask your doctor whether you can return to your normal activities. You may be asked to drink more fluids. This information is not intended to replace advice given to you by your health care provider. Make sure you discuss any  questions you have with your health care provider. Document Revised: 05/03/2018 Document Reviewed: 06/27/2017 Elsevier Patient Education  [place].

## 2019-05-29 DIAGNOSIS — J301 Allergic rhinitis due to pollen: Secondary | ICD-10-CM | POA: Diagnosis not present

## 2019-05-29 DIAGNOSIS — G4733 Obstructive sleep apnea (adult) (pediatric): Secondary | ICD-10-CM | POA: Diagnosis not present

## 2019-05-29 DIAGNOSIS — J449 Chronic obstructive pulmonary disease, unspecified: Secondary | ICD-10-CM | POA: Diagnosis not present

## 2019-05-30 ENCOUNTER — Emergency Department (HOSPITAL_COMMUNITY): Payer: Medicare Other

## 2019-05-30 ENCOUNTER — Inpatient Hospital Stay (HOSPITAL_COMMUNITY)
Admission: EM | Admit: 2019-05-30 | Discharge: 2019-06-01 | DRG: 309 | Disposition: A | Discharge status: Home / Self Care | Payer: Medicare Other | Attending: Internal Medicine | Admitting: Internal Medicine

## 2019-05-30 ENCOUNTER — Telehealth: Payer: Self-pay | Admitting: Physician Assistant

## 2019-05-30 ENCOUNTER — Encounter (HOSPITAL_COMMUNITY): Payer: Self-pay

## 2019-05-30 DIAGNOSIS — Z66 Do not resuscitate: Secondary | ICD-10-CM | POA: Diagnosis not present

## 2019-05-30 DIAGNOSIS — I129 Hypertensive chronic kidney disease with stage 1 through stage 4 chronic kidney disease, or unspecified chronic kidney disease: Secondary | ICD-10-CM | POA: Diagnosis present

## 2019-05-30 DIAGNOSIS — Z7984 Long term (current) use of oral hypoglycemic drugs: Secondary | ICD-10-CM

## 2019-05-30 DIAGNOSIS — E119 Type 2 diabetes mellitus without complications: Secondary | ICD-10-CM

## 2019-05-30 DIAGNOSIS — R000 Tachycardia, unspecified: Secondary | ICD-10-CM | POA: Diagnosis not present

## 2019-05-30 DIAGNOSIS — I10 Essential (primary) hypertension: Secondary | ICD-10-CM | POA: Diagnosis present

## 2019-05-30 DIAGNOSIS — R42 Dizziness and giddiness: Secondary | ICD-10-CM | POA: Diagnosis not present

## 2019-05-30 DIAGNOSIS — R0989 Other specified symptoms and signs involving the circulatory and respiratory systems: Secondary | ICD-10-CM | POA: Diagnosis not present

## 2019-05-30 DIAGNOSIS — Z801 Family history of malignant neoplasm of trachea, bronchus and lung: Secondary | ICD-10-CM

## 2019-05-30 DIAGNOSIS — I472 Ventricular tachycardia: Secondary | ICD-10-CM | POA: Diagnosis not present

## 2019-05-30 DIAGNOSIS — E1122 Type 2 diabetes mellitus with diabetic chronic kidney disease: Secondary | ICD-10-CM | POA: Diagnosis not present

## 2019-05-30 DIAGNOSIS — Z803 Family history of malignant neoplasm of breast: Secondary | ICD-10-CM

## 2019-05-30 DIAGNOSIS — E049 Nontoxic goiter, unspecified: Secondary | ICD-10-CM | POA: Diagnosis not present

## 2019-05-30 DIAGNOSIS — N1831 Chronic kidney disease, stage 3a: Secondary | ICD-10-CM | POA: Diagnosis present

## 2019-05-30 DIAGNOSIS — Z8249 Family history of ischemic heart disease and other diseases of the circulatory system: Secondary | ICD-10-CM

## 2019-05-30 DIAGNOSIS — Z90710 Acquired absence of both cervix and uterus: Secondary | ICD-10-CM

## 2019-05-30 DIAGNOSIS — Z87891 Personal history of nicotine dependence: Secondary | ICD-10-CM

## 2019-05-30 DIAGNOSIS — E785 Hyperlipidemia, unspecified: Secondary | ICD-10-CM | POA: Diagnosis present

## 2019-05-30 DIAGNOSIS — J9611 Chronic respiratory failure with hypoxia: Secondary | ICD-10-CM | POA: Diagnosis present

## 2019-05-30 DIAGNOSIS — Z20822 Contact with and (suspected) exposure to covid-19: Secondary | ICD-10-CM | POA: Diagnosis not present

## 2019-05-30 DIAGNOSIS — J449 Chronic obstructive pulmonary disease, unspecified: Secondary | ICD-10-CM | POA: Diagnosis present

## 2019-05-30 DIAGNOSIS — R0602 Shortness of breath: Secondary | ICD-10-CM | POA: Diagnosis not present

## 2019-05-30 DIAGNOSIS — I4729 Other ventricular tachycardia: Secondary | ICD-10-CM

## 2019-05-30 DIAGNOSIS — Z79899 Other long term (current) drug therapy: Secondary | ICD-10-CM

## 2019-05-30 DIAGNOSIS — H409 Unspecified glaucoma: Secondary | ICD-10-CM | POA: Diagnosis present

## 2019-05-30 DIAGNOSIS — R079 Chest pain, unspecified: Secondary | ICD-10-CM | POA: Diagnosis not present

## 2019-05-30 DIAGNOSIS — Z823 Family history of stroke: Secondary | ICD-10-CM

## 2019-05-30 DIAGNOSIS — Z833 Family history of diabetes mellitus: Secondary | ICD-10-CM

## 2019-05-30 DIAGNOSIS — K219 Gastro-esophageal reflux disease without esophagitis: Secondary | ICD-10-CM | POA: Diagnosis present

## 2019-05-30 DIAGNOSIS — Z9981 Dependence on supplemental oxygen: Secondary | ICD-10-CM

## 2019-05-30 DIAGNOSIS — G4733 Obstructive sleep apnea (adult) (pediatric): Secondary | ICD-10-CM | POA: Diagnosis present

## 2019-05-30 LAB — BASIC METABOLIC PANEL
Anion gap: 10 (ref 5–15)
BUN: 13 mg/dL (ref 8–23)
CO2: 28 mmol/L (ref 22–32)
Calcium: 9.4 mg/dL (ref 8.9–10.3)
Chloride: 105 mmol/L (ref 98–111)
Creatinine, Ser: 0.88 mg/dL (ref 0.44–1.00)
GFR calc Af Amer: >60 mL/min (ref >60)
GFR calc non Af Amer: 59 mL/min — ABNORMAL LOW (ref >60)
Glucose, Bld: 109 mg/dL — ABNORMAL HIGH (ref 70–99)
Potassium: 4 mmol/L (ref 3.5–5.1)
Sodium: 143 mmol/L (ref 135–145)

## 2019-05-30 LAB — CBC
HCT: 37.7 % (ref 36.0–46.0)
Hemoglobin: 11.1 g/dL — ABNORMAL LOW (ref 12.0–15.0)
MCH: 24.3 pg — ABNORMAL LOW (ref 26.0–34.0)
MCHC: 29.4 g/dL — ABNORMAL LOW (ref 30.0–36.0)
MCV: 82.7 fL (ref 80.0–100.0)
Platelets: 199 10*3/uL (ref 150–400)
RBC: 4.56 MIL/uL (ref 3.87–5.11)
RDW: 16.1 % — ABNORMAL HIGH (ref 11.5–15.5)
WBC: 10.7 10*3/uL — ABNORMAL HIGH (ref 4.0–10.5)
nRBC: 0 % (ref 0.0–0.2)

## 2019-05-30 LAB — TROPONIN I (HIGH SENSITIVITY): Troponin I (High Sensitivity): 6 ng/L (ref <18)

## 2019-05-30 MED ORDER — SODIUM CHLORIDE 0.9% FLUSH
3.0000 mL | Freq: Once | INTRAVENOUS | Status: AC
  Administered 2019-05-31: 3 mL via INTRAVENOUS

## 2019-05-30 NOTE — Telephone Encounter (Signed)
   Received after hours page from [employer]. Patient recently seen in office for near syncope, monitor ordered. Tonight at 6:19CST (7:19 EST) the patient had 7 seconds of ventricular tachycardia at 170bpm. D/w Dr. Mayford Knife - given recent history, needs to go to ED via EMS for further evaluation given recent near-syncope. I could not originally get a hold of patient at number provided but called sister/DPR Patsy who was with the patient. Relayed recommendation for calling EMS and information of why they need to do so. Family wrote down the monitor results to show EMS. They verbalized understanding and gratitude and will call right away. Will route to ordering MD to make them aware.  Laurann Montana, PA-C

## 2019-05-30 NOTE — ED Triage Notes (Signed)
Pt comes via [facility] for dizziness and SOB, has Holter monitor on for the past month, at around 7pm has 7 second run of vtach. Pt does not have any c/o now, pt wears 2L all the time.

## 2019-05-31 ENCOUNTER — Observation Stay (HOSPITAL_COMMUNITY): Payer: Medicare Other

## 2019-05-31 ENCOUNTER — Encounter (HOSPITAL_COMMUNITY): Payer: Self-pay | Admitting: Internal Medicine

## 2019-05-31 ENCOUNTER — Other Ambulatory Visit: Payer: Self-pay

## 2019-05-31 ENCOUNTER — Telehealth (HOSPITAL_COMMUNITY): Payer: Self-pay

## 2019-05-31 ENCOUNTER — Telehealth: Payer: Self-pay | Admitting: Cardiovascular Disease

## 2019-05-31 DIAGNOSIS — Z7984 Long term (current) use of oral hypoglycemic drugs: Secondary | ICD-10-CM | POA: Diagnosis not present

## 2019-05-31 DIAGNOSIS — I10 Essential (primary) hypertension: Secondary | ICD-10-CM

## 2019-05-31 DIAGNOSIS — Z823 Family history of stroke: Secondary | ICD-10-CM | POA: Diagnosis not present

## 2019-05-31 DIAGNOSIS — K219 Gastro-esophageal reflux disease without esophagitis: Secondary | ICD-10-CM | POA: Diagnosis present

## 2019-05-31 DIAGNOSIS — J9611 Chronic respiratory failure with hypoxia: Secondary | ICD-10-CM | POA: Diagnosis present

## 2019-05-31 DIAGNOSIS — J449 Chronic obstructive pulmonary disease, unspecified: Secondary | ICD-10-CM | POA: Diagnosis present

## 2019-05-31 DIAGNOSIS — E1122 Type 2 diabetes mellitus with diabetic chronic kidney disease: Secondary | ICD-10-CM | POA: Diagnosis present

## 2019-05-31 DIAGNOSIS — I129 Hypertensive chronic kidney disease with stage 1 through stage 4 chronic kidney disease, or unspecified chronic kidney disease: Secondary | ICD-10-CM | POA: Diagnosis present

## 2019-05-31 DIAGNOSIS — R0789 Other chest pain: Secondary | ICD-10-CM

## 2019-05-31 DIAGNOSIS — Z8249 Family history of ischemic heart disease and other diseases of the circulatory system: Secondary | ICD-10-CM | POA: Diagnosis not present

## 2019-05-31 DIAGNOSIS — Z20822 Contact with and (suspected) exposure to covid-19: Secondary | ICD-10-CM | POA: Diagnosis present

## 2019-05-31 DIAGNOSIS — Z833 Family history of diabetes mellitus: Secondary | ICD-10-CM | POA: Diagnosis not present

## 2019-05-31 DIAGNOSIS — Z803 Family history of malignant neoplasm of breast: Secondary | ICD-10-CM | POA: Diagnosis not present

## 2019-05-31 DIAGNOSIS — N1831 Chronic kidney disease, stage 3a: Secondary | ICD-10-CM | POA: Diagnosis present

## 2019-05-31 DIAGNOSIS — Z66 Do not resuscitate: Secondary | ICD-10-CM | POA: Diagnosis not present

## 2019-05-31 DIAGNOSIS — Z90710 Acquired absence of both cervix and uterus: Secondary | ICD-10-CM | POA: Diagnosis not present

## 2019-05-31 DIAGNOSIS — Z9981 Dependence on supplemental oxygen: Secondary | ICD-10-CM | POA: Diagnosis not present

## 2019-05-31 DIAGNOSIS — G4733 Obstructive sleep apnea (adult) (pediatric): Secondary | ICD-10-CM | POA: Diagnosis present

## 2019-05-31 DIAGNOSIS — I472 Ventricular tachycardia: Secondary | ICD-10-CM

## 2019-05-31 DIAGNOSIS — Z801 Family history of malignant neoplasm of trachea, bronchus and lung: Secondary | ICD-10-CM | POA: Diagnosis not present

## 2019-05-31 DIAGNOSIS — H409 Unspecified glaucoma: Secondary | ICD-10-CM | POA: Diagnosis present

## 2019-05-31 DIAGNOSIS — E785 Hyperlipidemia, unspecified: Secondary | ICD-10-CM | POA: Diagnosis present

## 2019-05-31 DIAGNOSIS — E049 Nontoxic goiter, unspecified: Secondary | ICD-10-CM | POA: Diagnosis present

## 2019-05-31 DIAGNOSIS — Z87891 Personal history of nicotine dependence: Secondary | ICD-10-CM | POA: Diagnosis not present

## 2019-05-31 DIAGNOSIS — Z79899 Other long term (current) drug therapy: Secondary | ICD-10-CM | POA: Diagnosis not present

## 2019-05-31 DIAGNOSIS — I4729 Other ventricular tachycardia: Secondary | ICD-10-CM

## 2019-05-31 LAB — NM MYOCAR MULTI W/SPECT W/WALL MOTION / EF
Estimated workload: 1 METS
Exercise duration (min): 8 min
Exercise duration (sec): 36 s
MPHR: 134 {beats}/min
Peak HR: 118 {beats}/min
Percent HR: 88 %
Rest HR: 71 {beats}/min

## 2019-05-31 LAB — RESPIRATORY PANEL BY RT PCR (FLU A&B, COVID)
Influenza A by PCR: NEGATIVE
Influenza B by PCR: NEGATIVE
SARS Coronavirus 2 by RT PCR: NEGATIVE

## 2019-05-31 LAB — TROPONIN I (HIGH SENSITIVITY): Troponin I (High Sensitivity): 7 ng/L (ref <18)

## 2019-05-31 LAB — PHOSPHORUS: Phosphorus: 3.5 mg/dL (ref 2.5–4.6)

## 2019-05-31 LAB — MAGNESIUM: Magnesium: 1.9 mg/dL (ref 1.7–2.4)

## 2019-05-31 MED ORDER — REGADENOSON 0.4 MG/5ML IV SOLN
0.4000 mg | Freq: Once | INTRAVENOUS | Status: AC
  Administered 2019-05-31: 0.4 mg via INTRAVENOUS

## 2019-05-31 MED ORDER — METOPROLOL SUCCINATE ER 50 MG PO TB24
50.0000 mg | ORAL_TABLET | Freq: Every day | ORAL | Status: DC
  Administered 2019-05-31 – 2019-06-01 (×2): 50 mg via ORAL
  Filled 2019-05-31 (×2): qty 1

## 2019-05-31 MED ORDER — TECHNETIUM TC 99M TETROFOSMIN IV KIT
10.7000 | PACK | Freq: Once | INTRAVENOUS | Status: AC | PRN
  Administered 2019-05-31: 10.7 via INTRAVENOUS

## 2019-05-31 MED ORDER — REGADENOSON 0.4 MG/5ML IV SOLN
INTRAVENOUS | Status: AC
  Filled 2019-05-31: qty 5

## 2019-05-31 MED ORDER — TECHNETIUM TC 99M TETROFOSMIN IV KIT
31.3000 | PACK | Freq: Once | INTRAVENOUS | Status: AC | PRN
  Administered 2019-05-31: 31.3 via INTRAVENOUS

## 2019-05-31 MED ORDER — MAGNESIUM SULFATE 2 GM/50ML IV SOLN
2.0000 g | Freq: Once | INTRAVENOUS | Status: AC
  Administered 2019-05-31: 2 g via INTRAVENOUS
  Filled 2019-05-31: qty 50

## 2019-05-31 MED ORDER — AMLODIPINE BESYLATE 5 MG PO TABS
5.0000 mg | ORAL_TABLET | Freq: Every day | ORAL | Status: DC
  Administered 2019-05-31 – 2019-06-01 (×2): 5 mg via ORAL
  Filled 2019-05-31 (×2): qty 1

## 2019-05-31 NOTE — ED Notes (Signed)
Patsy (Sister)stop at [employer] station for update from Admit MD.  MD was paged.

## 2019-05-31 NOTE — ED Notes (Signed)
Report given to floor nurse [[id number]].

## 2019-05-31 NOTE — Progress Notes (Signed)
PROGRESS NOTE    Heather Aguilar  ZOX:[id number] DOB: 1930-01-31 DOA: 05/30/2019 PCP: Casper Harrison, [name], MD   Brief Narrative:  Heather Aguilar is a 86 y.o. female with medical history significant of COPD on home oxygen, OSA on CPAP, pulmonary nodule, dyslipidemia, hypertension, type 2 diabetes  who is coming to the emergency department after her event recorder show a 7 beat run of V. tach.  She stated she have a brief episode of lightheadedness in the afternoon, but she denies having any symptoms with this episode.  No chest pain, worsening chronic dyspnea, palpitations, diaphoresis, PND orthopnea.  She occasionally gets lower extremity edema.  No fever, chills, sore throat, rhinorrhea, wheezing or hemoptysis.  Denies abdominal pain, diarrhea, constipation, melena or hematochezia.  No dysuria, frequency or hematuria.  No polyuria, polydipsia, polyphagia or blurred vision. In ED pt initial vital signs temperature 98.3 F, pulse 100, respirations 16, blood pressure 113/1 14 mmHg and O2 sat 99%.  Cardiology was consulted. CBC shows a white count 10.7, hemoglobin 11.1 g/dL and platelets 811.  BMP shows a glucose of 109 mg/dL, but is otherwise normal.  EKG is NSR with nonspecific T wave abnormality. Troponin x2 was normal.  Phosphorus 3.5 and magnesium 1.9 mg/dL.  Her chest radiograph does not show any active cardiopulmonary pathology.  Cardiology following - planning stress test later today. Possible further evaluation with EP given near-syncope and NSVT runs.  Assessment & Plan:   Principal Problem:   Ventricular tachycardia, non-sustained (HCC) Active Problems:   Essential hypertension   Dyslipidemia   Type 2 diabetes mellitus (HCC)   COPD (chronic obstructive pulmonary disease) (HCC)   Chronic respiratory failure with hypoxia (HCC)   Stage 3a chronic kidney disease   Glaucoma  NVST, now rate controlled, questionably symptomatic prior to admission Continue telemetry/EKG PRN Cardiology consult  appreciated - stress test pending - EP likely to evaluate later today  Hold metoprolol  Essential hypertension Holding metoprolol Defer to cardiology - might need to hold amlodipine pending BP  Dyslipidemia On atorvastatin.  Type 2 diabetes mellitus (HCC) Currently n.p.o. for procedure as above Hold Metformin. CBG monitoring.  COPD (chronic obstructive pulmonary disease) (HCC), without acute exacerbation Chronic respiratory failure with hypoxia (HCC) on 2L  around the clock, POA Compensated at this time. Supplemental oxygen as needed. Bronchodilators as needed.  Stage 3a chronic kidney disease Monitor renal function electrolytes.  Glaucoma Continue Alphagan and Travatan drops.   DVT prophylaxis: Lovenox SQ. Code Status: Full code. Family Communication: Sister at bedside this am but was not available during interview Disposition Plan:              Patient is from:                        Home.             Anticipated DC to:                   Home.             Anticipated DC date:               06/01/2019.             Anticipated DC barriers:         Cardiology work-up.  Consults called:        Aniceto Boss, MD (cardiology). Admission status:     Observation/telemetry.   Subjective: No acute issues/events overnight -  denies chest pain, shortness of breath, nausea, or palpitations.  Objective: Vitals:   05/31/19 1411 05/31/19 1413 05/31/19 1415 05/31/19 1417  BP: (!) 159/78 (!) 186/76 (!) 180/74 (!) 174/70  Pulse:      Resp:      Temp:      TempSrc:      SpO2:        Intake/Output Summary (Last 24 hours) at 05/31/2019 1552 Last data filed at 05/31/2019 1540 Gross per 24 hour  Intake 50 ml  Output --  Net 50 ml   There were no vitals filed for this visit.  Examination:  General exam: Appears calm and comfortable  Respiratory system: Clear to auscultation. Respiratory effort normal. Cardiovascular system: S1 & S2 heard, RRR. No JVD, murmurs, rubs,  gallops or clicks. No pedal edema. Gastrointestinal system: Abdomen is nondistended, soft and nontender. No organomegaly or masses felt. Normal bowel sounds heard. Central nervous system: Alert and oriented. No focal neurological deficits. Extremities: Symmetric 5 x 5 power. Skin: No rashes, lesions or ulcers Psychiatry: Judgement and insight appear normal. Mood & affect appropriate.     Data Reviewed: I have personally reviewed following labs and imaging studies  CBC: Recent Labs  Lab 05/30/19 2147  WBC 10.7*  HGB 11.1*  HCT 37.7  MCV 82.7  PLT 199   Basic Metabolic Panel: Recent Labs  Lab 05/30/19 2147 05/31/19 0155  NA 143  --   K 4.0  --   CL 105  --   CO2 28  --   GLUCOSE 109*  --   BUN 13  --   CREATININE 0.88  --   CALCIUM 9.4  --   MG  --  1.9  PHOS  --  3.5   GFR: Estimated Creatinine Clearance: 43.5 mL/min (by C-G formula based on SCr of 0.88 mg/dL). Liver Function Tests: No results for input(s): AST, ALT, ALKPHOS, BILITOT, PROT, ALBUMIN in the last 168 hours. No results for input(s): LIPASE, AMYLASE in the last 168 hours. No results for input(s): AMMONIA in the last 168 hours. Coagulation Profile: No results for input(s): INR, PROTIME in the last 168 hours. Cardiac Enzymes: No results for input(s): CKTOTAL, CKMB, CKMBINDEX, TROPONINI in the last 168 hours. BNP (last 3 results) No results for input(s): PROBNP in the last 8760 hours. HbA1C: No results for input(s): HGBA1C in the last 72 hours. CBG: No results for input(s): GLUCAP in the last 168 hours. Lipid Profile: No results for input(s): CHOL, HDL, LDLCALC, TRIG, CHOLHDL, LDLDIRECT in the last 72 hours. Thyroid Function Tests: No results for input(s): TSH, T4TOTAL, FREET4, T3FREE, THYROIDAB in the last 72 hours. Anemia Panel: No results for input(s): VITAMINB12, FOLATE, FERRITIN, TIBC, IRON, RETICCTPCT in the last 72 hours. Sepsis Labs: No results for input(s): PROCALCITON, LATICACIDVEN in the  last 168 hours.  Recent Results (from the past 240 hour(s))  Respiratory Panel by RT PCR (Flu A&B, Covid) - Nasopharyngeal Swab     Status: None   Collection Time: 05/31/19  7:26 AM   Specimen: Nasopharyngeal Swab  Result Value Ref Range Status   SARS Coronavirus 2 by RT PCR NEGATIVE NEGATIVE Final    Comment: (NOTE) SARS-CoV-2 target nucleic acids are NOT DETECTED. The SARS-CoV-2 RNA is generally detectable in upper respiratoy specimens during the acute phase of infection. The lowest concentration of SARS-CoV-2 viral copies this assay can detect is 131 copies/mL. A negative result does not preclude SARS-Cov-2 infection and should not be used as the sole basis  for treatment or other patient management decisions. A negative result may occur with  improper specimen collection/handling, submission of specimen other than nasopharyngeal swab, presence of viral mutation(s) within the areas targeted by this assay, and inadequate number of viral copies (<131 copies/mL). A negative result must be combined with clinical observations, patient history, and epidemiological information. The expected result is Negative. Fact Sheet for Patients:  PinkCheek.be Fact Sheet for Healthcare Providers:  GravelBags.it This test is not yet ap proved or cleared by the [place] FDA and  has been authorized for detection and/or diagnosis of SARS-CoV-2 by FDA under an Emergency Use Authorization (EUA). This EUA will remain  in effect (meaning this test can be used) for the duration of the COVID-19 declaration under Section 564(b)(1) of the Act, 21 U.S.C. section 360bbb-3(b)(1), unless the authorization is terminated or revoked sooner.    Influenza A by PCR NEGATIVE NEGATIVE Final   Influenza B by PCR NEGATIVE NEGATIVE Final    Comment: (NOTE) The Xpert Xpress SARS-CoV-2/FLU/RSV assay is intended as an aid in  the diagnosis of influenza from  Nasopharyngeal swab specimens and  should not be used as a sole basis for treatment. Nasal washings and  aspirates are unacceptable for Xpert Xpress SARS-CoV-2/FLU/RSV  testing. Fact Sheet for Patients: PinkCheek.be Fact Sheet for Healthcare Providers: GravelBags.it This test is not yet approved or cleared by the [place] FDA and  has been authorized for detection and/or diagnosis of SARS-CoV-2 by  FDA under an Emergency Use Authorization (EUA). This EUA will remain  in effect (meaning this test can be used) for the duration of the  Covid-19 declaration under Section 564(b)(1) of the Act, 21  U.S.C. section 360bbb-3(b)(1), unless the authorization is  terminated or revoked. Performed at [facility], [place] [address], [place], [place] [address]      Radiology Studies: DG Chest 2 View  Result Date: 05/30/2019 CLINICAL DATA:  86 year female with chest pain. EXAM: CHEST - 2 VIEW COMPARISON:  Chest radiograph dated 05/06/2019. FINDINGS: Mild diffuse interstitial coarsening and chronic bronchitic changes. No focal consolidation, pleural effusion, pneumothorax. Stable cardiomediastinal silhouette. No acute osseous pathology. IMPRESSION: No active cardiopulmonary disease.  No interval change. Electronically Signed   By: Anner Crete M.D.   On: 05/30/2019 21:57    Scheduled Meds: . amLODipine  5 mg Oral Daily  . metoprolol succinate  50 mg Oral Daily  . regadenoson       Continuous Infusions:   LOS: 0 days   Time spent: 56min  Sharian Delia C Keli Buehner, DO [facility]  If 7PM-7AM, please contact night-coverage [email or web address]  05/31/2019, 3:52 PM

## 2019-05-31 NOTE — ED Notes (Signed)
Pt. Transported to [facility] at this time. N.A.D.

## 2019-05-31 NOTE — ED Provider Notes (Signed)
[facility] EMERGENCY DEPARTMENT Provider Note   CSN: [id number] Arrival date & time: 05/30/19  2127     History Chief Complaint  Patient presents with  . Dizziness    Heather Aguilar is a 86 y.o. female with a history of COPD on 2 L home O2, diabetes mellitus, OSA on CPAP, dyslipidemia, HTN who presents to the emergency department with a chief complaint of dizziness and shortness of breath.  The patient reports that she had a brief episode of dizziness and shortness of breath earlier tonight.  The symptoms have since resolved.  She currently has a 30-day Holter monitor in place and received a call tonight that she had a 7-second run of ventricular tachycardia with a heart rate of 170 and was advised to come to the ER by cardiology.  She has no complaints at this time including palpitations, dizziness, lightheadedness, chest pain, shortness of breath, cough, leg swelling, abdominal pain, nausea, vomiting, diarrhea.  She is chronically on 2 L of home oxygen for COPD.  Cardiologist is Dr. Duke Salvia.  She is scheduled for a stress test on May 11.  She recently had an echo during a 2-day hospitalization on April 11 through the 13th for near syncope.   The history is provided by the patient. No language interpreter was used.       Past Medical History:  Diagnosis Date  . COPD (chronic obstructive pulmonary disease) (HCC)    on home O2  . Diabetes mellitus (HCC)   . Dyslipidemia   . Essential hypertension   . OSA (obstructive sleep apnea)    wears CPAP  . Pulmonary nodule   . Thyroid goiter     Patient Active Problem List   Diagnosis Date Noted  . Ventricular tachycardia, non-sustained (HCC) 05/31/2019  . Glaucoma 05/31/2019  . Near syncope 05/06/2019  . Chronic respiratory failure with hypoxia (HCC) 05/06/2019  . Stage 3a chronic kidney disease 05/06/2019  . Thyroid goiter   . Pulmonary nodule   . OSA (obstructive sleep apnea)   . Essential hypertension   .  Dyslipidemia   . Type 2 diabetes mellitus (HCC)   . COPD (chronic obstructive pulmonary disease) (HCC)     Past Surgical History:  Procedure Laterality Date  . ABDOMINAL HYSTERECTOMY    . COLON SURGERY       OB History   No obstetric history on file.     Family History  Problem Relation Age of Onset  . Heart attack Mother   . Heart attack Father   . Diabetes Sister   . Stroke Sister   . Diabetes Brother   . Diabetes Maternal Aunt   . Stroke Daughter   . Lung cancer Brother   . Breast cancer Sister     Social History   Tobacco Use  . Smoking status: Former Smoker    Packs/day: 0.75    Years: 40.00    Pack years: 30.00    Quit date: 1980    Years since quitting: 41.3  . Smokeless tobacco: Never Used  Substance Use Topics  . Alcohol use: Not Currently    Comment: socially remotely  . Drug use: Never    Home Medications Prior to Admission medications   Medication Sig Start Date End Date Taking? Authorizing Provider  acetaminophen (TYLENOL) 500 MG tablet Take 500-1,000 mg by mouth every 6 (six) hours as needed for mild pain or headache.   Yes [provider]  ALPHAGAN P 0.15 % ophthalmic  solution Place 1 drop into both eyes daily. 01/01/19  Yes [provider]  ALPRAZolam Prudy Feeler) 0.25 MG tablet Take 0.25 mg by mouth daily as needed for anxiety. 05/01/19  Yes [provider]  amLODipine (NORVASC) 5 MG tablet Take 1 tablet (5 mg total) by mouth daily. 05/09/19  Yes Vann, Jessica U, DO  atorvastatin (LIPITOR) 20 MG tablet Take 20 mg by mouth daily. 05/01/19  Yes [provider]  ferrous sulfate 325 (65 FE) MG tablet Take 325 mg by mouth daily with lunch.   Yes [provider]  furosemide (LASIX) 20 MG tablet Take 20 mg by mouth daily as needed (for swelling of the ankles).  05/01/19  Yes [provider]  ipratropium-albuterol (DUONEB) 0.5-2.5 (3) MG/3ML SOLN Take 3 mLs by nebulization 2 (two) times daily. 04/24/19  Yes  [provider]  lansoprazole (PREVACID) 30 MG capsule Take 30 mg by mouth daily before breakfast. 05/01/19  Yes [provider]  levalbuterol (XOPENEX HFA) 45 MCG/ACT inhaler Inhale 2 puffs into the lungs every 6 (six) hours as needed for shortness of breath. 04/30/19  Yes [provider]  meloxicam (MOBIC) 15 MG tablet Take 15 mg by mouth daily as needed for pain. 04/21/19  Yes [provider]  metFORMIN (GLUCOPHAGE) 500 MG tablet Take 500 mg by mouth 2 (two) times daily. 05/01/19  Yes [provider]  metoprolol succinate (TOPROL-XL) 50 MG 24 hr tablet Take 50 mg by mouth daily. 05/01/19  Yes [provider]  TRAVATAN Z 0.004 % SOLN ophthalmic solution Place 1 drop into both eyes in the morning and at bedtime. 11/27/18  Yes [provider]  ANORO ELLIPTA 62.5-25 MCG/INH AEPB Inhale 1 puff into the lungs daily. 05/29/19   [provider]  OXYGEN Inhale 2 L/min into the lungs continuous.    [provider]  PRESCRIPTION MEDICATION CPAP- At bedtime and during any times of rest (WITH OXYGEN)    [provider]    Allergies    Demerol [meperidine] and Talwin [pentazocine]  Review of Systems   Review of Systems  Constitutional: Negative for activity change, chills and fever.  HENT: Negative for congestion and sore throat.   Eyes: Negative for visual disturbance.  Respiratory: Positive for shortness of breath. Negative for cough and wheezing.   Cardiovascular: Negative for chest pain, palpitations and leg swelling.  Gastrointestinal: Negative for abdominal pain, diarrhea, nausea and vomiting.  Genitourinary: Negative for dysuria.  Musculoskeletal: Negative for back pain, myalgias, neck pain and neck stiffness.  Skin: Negative for rash.  Allergic/Immunologic: Negative for immunocompromised state.  Neurological: Positive for dizziness. Negative for seizures, syncope, weakness, light-headedness and headaches.    Psychiatric/Behavioral: Negative for confusion.    Physical Exam Updated Vital Signs BP (!) 156/79   Pulse 63   Temp 98.3 F (36.8 C) (Oral)   Resp 16   LMP  (LMP Unknown)   SpO2 97%   Physical Exam Vitals and nursing note reviewed.  Constitutional:      General: She is not in acute distress.    Appearance: She is not ill-appearing, toxic-appearing or diaphoretic.     Comments: Nasal cannula in place  HENT:     Head: Normocephalic.  Eyes:     Conjunctiva/sclera: Conjunctivae normal.  Cardiovascular:     Rate and Rhythm: Normal rate and regular rhythm.     Heart sounds: No murmur. No friction rub. No gallop.   Pulmonary:     Effort: Pulmonary effort  is normal. No respiratory distress.     Breath sounds: No stridor. No wheezing, rhonchi or rales.  Chest:     Chest wall: No tenderness.  Abdominal:     General: There is no distension.     Palpations: Abdomen is soft. There is no mass.     Tenderness: There is no abdominal tenderness. There is no right CVA tenderness, left CVA tenderness, guarding or rebound.     Hernia: No hernia is present.  Musculoskeletal:     Cervical back: Neck supple.     Right lower leg: No edema.     Left lower leg: No edema.  Skin:    General: Skin is warm.     Findings: No rash.  Neurological:     Mental Status: She is alert.  Psychiatric:        Behavior: Behavior normal.     ED Results / Procedures / Treatments   Labs (all labs ordered are listed, but only abnormal results are displayed) Labs Reviewed  BASIC METABOLIC PANEL - Abnormal; Notable for the following components:      Result Value   Glucose, Bld 109 (*)    GFR calc non Af Amer 59 (*)    All other components within normal limits  CBC - Abnormal; Notable for the following components:   WBC 10.7 (*)    Hemoglobin 11.1 (*)    MCH 24.3 (*)    MCHC 29.4 (*)    RDW 16.1 (*)    All other components within normal limits  RESPIRATORY PANEL BY RT PCR (FLU A&B, COVID)   MAGNESIUM  PHOSPHORUS  TROPONIN I (HIGH SENSITIVITY)  TROPONIN I (HIGH SENSITIVITY)    EKG EKG Interpretation  Date/Time:  Wednesday May 30 2019 21:39:33 EDT Ventricular Rate:  96 PR Interval:  128 QRS Duration: 76 QT Interval:  324 QTC Calculation: 409 R Axis:   60 Text Interpretation: Normal sinus rhythm Nonspecific T wave abnormality Abnormal ECG Confirmed by Gilda Crease [phone]) on 05/31/2019 3:54:10 AM   Radiology DG Chest 2 View  Result Date: 05/30/2019 CLINICAL DATA:  86 year female with chest pain. EXAM: CHEST - 2 VIEW COMPARISON:  Chest radiograph dated 05/06/2019. FINDINGS: Mild diffuse interstitial coarsening and chronic bronchitic changes. No focal consolidation, pleural effusion, pneumothorax. Stable cardiomediastinal silhouette. No acute osseous pathology. IMPRESSION: No active cardiopulmonary disease.  No interval change. Electronically Signed   By: Elgie Collard M.D.   On: 05/30/2019 21:57    Procedures Procedures (including critical care time)  Medications Ordered in ED Medications  sodium chloride flush (NS) 0.9 % injection 3 mL (has no administration in time range)  magnesium sulfate IVPB 2 g 50 mL (2 g Intravenous New Bag/Given 05/31/19 1937)    ED Course  I have reviewed the triage vital signs and the nursing notes.  Pertinent labs & imaging results that were available during my care of the patient were reviewed by me and considered in my medical decision making (see chart for details).    MDM Rules/Calculators/A&P                       86 year old female with a history of COPD on 2 L home O2, diabetes mellitus, OSA on CPAP, dyslipidemia, HTN who presents to the emergency department from home with a chief complaint of nonsustained ventricular tachycardia.  Patient had a brief episode earlier tonight with dizziness and shortness of breath.  Symptoms have since resolved.  She  later got a call after her Holter monitor recorded a 7  seconds run of ventricular tachycardia with a heart rate in the 170s.  She was advised to come to the ER for further work-up and evaluation.  EKG was sinus rhythm.  Chest x-ray is unremarkable.  Magnesium is normal.  She has no electrolyte derangements.  The patient was discussed and independently evaluated by Betsey Holiday, attending physician.   Spoke with Dr. Hassell Done with cardiology.  She will come and evaluate the patient in the ER.  Dr. Hassell Done has discussed the patient with Dr. Radford Pax, cardiology.  Dr. Radford Pax wishes to have the patient admitted so EP can see her in the morning.  We will keep patient n.p.o.  COVID-19 test has been ordered.  Cardiology recommends medical admission given the patient's history of severe COPD.  Consult to the hospitalist team and Dr. Olevia Bowens will accept the patient for admission.  The patient appears reasonably stabilized for admission considering the current resources, flow, and capabilities available in the ED at this time, and I doubt any other [facility] requiring further screening and/or treatment in the ED prior to admission.   Final Clinical Impression(s) / ED Diagnoses Final diagnoses:  Nonsustained ventricular tachycardia [facility])    Rx / DC Orders ED Discharge Orders    None       Joanne Gavel, PA-C 05/31/19 0739    Orpah Greek, MD 06/01/19 [phone]

## 2019-05-31 NOTE — H&P (Signed)
History and Physical    Heather Aguilar GUR:[id number] DOB: 1930-05-21 DOA: 05/30/2019  PCP: Venetia Maxon Sharon Mt, MD   Patient coming from: Home.  I have personally briefly reviewed patient's old medical records in [place]  Chief Complaint: V. tach  HPI: Heather Aguilar is a 86 y.o. female with medical history significant of COPD on home oxygen, OSA on CPAP, pulmonary nodule, dyslipidemia, hypertension, type 2 diabetes  who is coming to the emergency department after her event recorder show a 7 beat run of V. tach.  She stated she have a brief episode of lightheadedness in the afternoon, but she denies having any symptoms with this episode.  No chest pain, worsening chronic dyspnea, palpitations, diaphoresis, PND orthopnea.  She occasionally gets lower extremity edema.  No fever, chills, sore throat, rhinorrhea, wheezing or hemoptysis.  Denies abdominal pain, diarrhea, constipation, melena or hematochezia.  No dysuria, frequency or hematuria.  No polyuria, polydipsia, polyphagia or blurred vision.  ED Course: Her initial vital signs temperature 98.3 F, pulse 100, respirations 16, blood pressure 113/1 14 mmHg and O2 sat 99%.  Cardiology was consulted.  CBC shows a white count 10.7, hemoglobin 11.1 g/dL and platelets 199.  BMP shows a glucose of 109 mg/dL, but is otherwise normal.  EKG is NSR with nonspecific T wave abnormality.  Troponin x2 was normal.  Phosphorus 3.5 and magnesium 1.9 mg/dL.  Her chest radiograph does not show any active cardiopulmonary pathology.  Review of Systems: As per HPI otherwise all other systems reviewed and are negative.  Past Medical History:  Diagnosis Date  . COPD (chronic obstructive pulmonary disease) ([place])    on home O2  . Diabetes mellitus ([place])   . Dyslipidemia   . Essential hypertension   . OSA (obstructive sleep apnea)    wears CPAP  . Pulmonary nodule   . Thyroid goiter     Past Surgical History:  Procedure Laterality Date  . ABDOMINAL  HYSTERECTOMY    . COLON SURGERY      Social History  reports that she quit smoking about 41 years ago. She has a 30.00 pack-year smoking history. She has never used smokeless tobacco. She reports previous alcohol use. She reports that she does not use drugs.  Allergies  Allergen Reactions  . Demerol [Meperidine] Hypertension    "Raised my blood pressure"  . Talwin [Pentazocine] Other (See Comments)    Caused hallucinations     Family History  Problem Relation Age of Onset  . Heart attack Mother   . Heart attack Father   . Diabetes Sister   . Stroke Sister   . Diabetes Brother   . Diabetes Maternal Aunt   . Stroke Daughter   . Lung cancer Brother   . Breast cancer Sister    Prior to Admission medications   Medication Sig Start Date End Date Taking? Authorizing Provider  acetaminophen (TYLENOL) 500 MG tablet Take 500-1,000 mg by mouth every 6 (six) hours as needed for mild pain or headache.    [provider]  ALPHAGAN P 0.15 % ophthalmic solution Place 1 drop into both eyes daily. 01/01/19   [provider]  ALPRAZolam Duanne Moron) 0.25 MG tablet Take 0.25 mg by mouth daily as needed for anxiety. 05/01/19   [provider]  amLODipine (NORVASC) 5 MG tablet Take 1 tablet (5 mg total) by mouth daily. 05/09/19   Geradine Girt, DO  ascorbic acid (VITAMIN C) 500 MG tablet Take 500-1,000 mg by mouth  daily.     [provider]  atorvastatin (LIPITOR) 20 MG tablet Take 20 mg by mouth daily. 05/01/19   [provider]  ferrous sulfate 325 (65 FE) MG tablet Take 325 mg by mouth daily with lunch.    [provider]  furosemide (LASIX) 20 MG tablet Take 20 mg by mouth daily as needed (for swelling of the ankles).  05/01/19   [provider]  ipratropium-albuterol (DUONEB) 0.5-2.5 (3) MG/3ML SOLN Take 3 mLs by nebulization 2 (two) times daily. 04/24/19   [provider]  lansoprazole (PREVACID) 30 MG capsule Take 30 mg by mouth  daily before breakfast. 05/01/19   [provider]  levalbuterol (XOPENEX HFA) 45 MCG/ACT inhaler Inhale 2 puffs into the lungs every 6 (six) hours as needed for shortness of breath. 04/30/19   [provider]  meloxicam (MOBIC) 15 MG tablet Take 15 mg by mouth daily as needed for pain. 04/21/19   [provider]  metFORMIN (GLUCOPHAGE) 500 MG tablet Take 500 mg by mouth 2 (two) times daily. 05/01/19   [provider]  metoprolol succinate (TOPROL-XL) 50 MG 24 hr tablet Take 50 mg by mouth daily. 05/01/19   [provider]  OXYGEN Inhale 2 L/min into the lungs continuous.    [provider]  PRESCRIPTION MEDICATION CPAP- At bedtime and during any times of rest (WITH OXYGEN)    [provider]  TRAVATAN Z 0.004 % SOLN ophthalmic solution Place 1 drop into both eyes in the morning and at bedtime. 11/27/18   [provider]    Physical Exam: Vitals:   05/30/19 2133 05/30/19 2145 05/31/19 0500  BP:  (!) 133/114 (!) 158/81  Pulse:  100 65  Resp:  16 10  Temp:  98.3 F (36.8 C)   TempSrc:  Oral   SpO2: 99% 99% 96%    Constitutional: NAD, calm, comfortable Eyes: PERRL, lids and conjunctivae normal ENMT: Mucous membranes are moist. Posterior pharynx clear of any exudate or lesions. Neck: normal, supple, no masses, no thyromegaly Respiratory: clear to auscultation bilaterally, no wheezing, no crackles. Normal respiratory effort. No accessory muscle use.  Cardiovascular: Regular rate and rhythm, no murmurs / rubs / gallops. No extremity edema. 2+ pedal pulses. No carotid bruits.  Abdomen: Nondistended.  Soft, no tenderness, no masses palpated. No hepatosplenomegaly. Bowel sounds positive.  Musculoskeletal: no clubbing / cyanosis.  Good ROM, no contractures. Normal muscle tone.  Skin: no rashes, lesions, ulcers on very limited otological examination. Neurologic: CN 2-12 grossly intact. Sensation intact, DTR normal. Strength 5/5 in all  4.  Psychiatric: Normal judgment and insight. Alert and oriented x 3. Normal mood.   Labs on Admission: I have personally reviewed following labs and imaging studies  CBC: Recent Labs  Lab 05/30/19 2147  WBC 10.7*  HGB 11.1*  HCT 37.7  MCV 82.7  PLT 199    Basic Metabolic Panel: Recent Labs  Lab 05/30/19 2147  NA 143  K 4.0  CL 105  CO2 28  GLUCOSE 109*  BUN 13  CREATININE 0.88  CALCIUM 9.4    GFR: Estimated Creatinine Clearance: 43.5 mL/min (by C-G formula based on SCr of 0.88 mg/dL).  Liver Function Tests: No results for input(s): AST, ALT, ALKPHOS, BILITOT, PROT, ALBUMIN in the last 168 hours.  Urine analysis: No results found for: COLORURINE, APPEARANCEUR, LABSPEC, PHURINE, GLUCOSEU, HGBUR, BILIRUBINUR, KETONESUR, PROTEINUR, UROBILINOGEN, NITRITE, LEUKOCYTESUR  Radiological Exams on Admission: DG Chest 2 View  Result Date: 05/30/2019  CLINICAL DATA:  86 year female with chest pain. EXAM: CHEST - 2 VIEW COMPARISON:  Chest radiograph dated 05/06/2019. FINDINGS: Mild diffuse interstitial coarsening and chronic bronchitic changes. No focal consolidation, pleural effusion, pneumothorax. Stable cardiomediastinal silhouette. No acute osseous pathology. IMPRESSION: No active cardiopulmonary disease.  No interval change. Electronically Signed   By: Elgie Collard M.D.   On: 05/30/2019 21:57   05/06/2019 echocardiogram IMPRESSIONS   1. Left ventricular ejection fraction, by estimation, is 65 to 70%. The  left ventricle has normal function. The left ventricle has no regional  wall motion abnormalities. There is moderate left ventricular hypertrophy  of the basal-septal segment. Left  ventricular diastolic parameters are consistent with Grade I diastolic  dysfunction (impaired relaxation).  2. Right ventricular systolic function is mildly reduced. The right  ventricular size is normal. Tricuspid regurgitation signal is inadequate  for assessing PA pressure.    3. The mitral valve is normal in structure. Mild mitral valve  regurgitation. No evidence of mitral stenosis.  4. The aortic valve is tricuspid. Aortic valve regurgitation is not  visualized. Mild aortic valve sclerosis is present, with no evidence of  aortic valve stenosis.   EKG: Independently reviewed. Vent. rate 96 BPM PR interval 128 ms QRS duration 76 ms QT/QTc 324/409 ms P-R-T axes 64 60 58 Normal sinus rhythm Nonspecific T wave abnormality Abnormal ECG  Assessment/Plan Principal Problem:   Ventricular tachycardia, non-sustained (HCC) Observation/telemetry. Cardiology consult appreciated. Scheduled for myocardial perfusion imaging. Hold metoprolol. The case will be discussed with electrophysiology. Keep electrolytes optimized.  Active Problems:   Essential hypertension Holding metoprolol pending Myoview. Continue amlodipine 5 mg p.o. daily. Monitor blood pressure and heart rate.    Dyslipidemia On atorvastatin.    Type 2 diabetes mellitus (HCC) Currently n.p.o. Hold Metformin. CBG monitoring.    COPD (chronic obstructive pulmonary disease) (HCC)   Chronic respiratory failure with hypoxia (HCC) Compensated at this time. Supplemental oxygen as needed. Bronchodilators as needed.    Stage 3a chronic kidney disease Monitor renal function electrolytes.    Glaucoma Continue Alphagan and Travatan drops.   DVT prophylaxis: Lovenox SQ. Code Status:   Full code. Family Communication: Disposition Plan:   Patient is from:  Home.  Anticipated DC to:  Home.  Anticipated DC date:  06/01/2019.  Anticipated DC barriers: Cardiology work-up.  Consults called:  Aniceto Boss, MD (cardiology). Admission status:  Observation/telemetry.   Severity of Illness: Mild to moderate severity.  Bobette Mo MD [facility]  How to contact the [facility] Attending or Consulting provider 7A - 7P or covering provider during after hours 7P -7A, for this patient?    1. Check the care team in [facility] and look for a) attending/consulting TRH provider listed and b) the [facility] team listed 2. Log into [email or web address] and use [facility]'s universal password to access. If you do not have the password, please contact the hospital operator. 3. Locate the [facility] provider you are looking for under [facility] and page to a number that you can be directly reached. 4. If you still have difficulty reaching the provider, please page the [facility] (Director on Call) for the Hospitalists listed on amion for assistance.  05/31/2019, 5:11 AM   This document was prepared using Dragon voice recognition software and may contain some unintended transcription errors.

## 2019-05-31 NOTE — Telephone Encounter (Signed)
Encounter complete. 

## 2019-05-31 NOTE — Telephone Encounter (Signed)
Spoke with Heather Aguilar w/[employer] Patient had episode of NSVT for 12 beats at HR of 170bpm today @ 10:39am Patient is in hospital at present time Sent to primary cardiologist as Lorain Childes

## 2019-05-31 NOTE — Progress Notes (Addendum)
   Heather Aguilar presented for a nuclear stress test today.  No immediate complications.  Stress imaging is pending at this time.  Preliminary EKG findings may be listed in the chart, but the stress test result will not be finalized until perfusion imaging is complete.  1 day study, [facility] to read.  BP elevated during the test, will order home BP rx.  Theodore Demark, PA-C 05/31/2019, 2:20 PM

## 2019-05-31 NOTE — Telephone Encounter (Signed)
New message  Calling with a critical EKG for patient.

## 2019-05-31 NOTE — Consult Note (Addendum)
Cardiology Consultation:   Patient ID: Heather Aguilar MRN: [id number]; DOB: Apr 04, 1930  Admit date: 05/30/2019 Date of Consult: 05/31/2019  Primary Care Provider: Casper Harrison, [name], MD Primary Cardiologist: Chilton Si, MD  Primary Electrophysiologist:  None    Patient Profile:   Heather Aguilar is a 86 y.o. female with a hx of OSA, HTN, COPD on home O2, dyslipidemia, CKD stage 3, with recent episode of medication-induced bradycardia (HR in the 30s) and near-syncope (felt to be vasovagal, but pt d/c'd with ambulatory monitor)  who is being seen today for the evaluation of NSVT noted on event monitoring at the request of [facility].  History of Present Illness:   Heather Aguilar has the above history. She was recently admitted at [facility] 05-05-19 and discharged 05-08-19 after an episode of near-syncope. Pt was at home sitting at her kitchen table when she felt lightheaded, like she was going to pass out. She had been very constipated just prior to this event. The episode lasted 4-5 minutes, then resolved. She went to the ED for evaluation. Contextually, her episode was felt to be vasovagal. However, during her hospital stay, notes indicate her HR dropped down into the 30s, necessitating discontinuation of verapamil. She was discharged w/ ambulatory monitor for further evaluation. She has not had any significant recurrent episodes of near-syncope since wearing the heart monitor. She says she did have a few seconds of brief dizziness around 3-4pm today. Later in the evening, [employer] notified on-call provider that a 7-second episode of NSVT had been recorded at 7:19pm EST. Pt is quite sure the brief episode she had earlier today was several hours before the VT episode. She says she was completely asymptomatic at the time of the NSVT.  Workup in the ED has been unremarkable w/ no acute EKG changes, negative troponins and normal electrolytes. TTE 05-06-19 was unremarkable w/ normal LVEF. Nuc stress test has been  scheduled for 06-05-19.  Past Medical History:  Diagnosis Date  . COPD (chronic obstructive pulmonary disease) (HCC)    on home O2  . Diabetes mellitus (HCC)   . Dyslipidemia   . Essential hypertension   . OSA (obstructive sleep apnea)    wears CPAP  . Pulmonary nodule   . Thyroid goiter     Past Surgical History:  Procedure Laterality Date  . ABDOMINAL HYSTERECTOMY    . COLON SURGERY       Home Medications:  Prior to Admission medications   Medication Sig Start Date End Date Taking? Authorizing Provider  acetaminophen (TYLENOL) 500 MG tablet Take 500-1,000 mg by mouth every 6 (six) hours as needed for mild pain or headache.    [provider]  ALPHAGAN P 0.15 % ophthalmic solution Place 1 drop into both eyes daily. 01/01/19   [provider]  ALPRAZolam Prudy Feeler) 0.25 MG tablet Take 0.25 mg by mouth daily as needed for anxiety. 05/01/19   [provider]  amLODipine (NORVASC) 5 MG tablet Take 1 tablet (5 mg total) by mouth daily. 05/09/19   Joseph Art, DO  ascorbic acid (VITAMIN C) 500 MG tablet Take 500-1,000 mg by mouth daily.     [provider]  atorvastatin (LIPITOR) 20 MG tablet Take 20 mg by mouth daily. 05/01/19   [provider]  ferrous sulfate 325 (65 FE) MG tablet Take 325 mg by mouth daily with lunch.    [provider]  furosemide (LASIX) 20 MG tablet Take 20 mg by mouth daily as needed (for swelling  of the ankles).  05/01/19   [provider]  ipratropium-albuterol (DUONEB) 0.5-2.5 (3) MG/3ML SOLN Take 3 mLs by nebulization 2 (two) times daily. 04/24/19   [provider]  lansoprazole (PREVACID) 30 MG capsule Take 30 mg by mouth daily before breakfast. 05/01/19   [provider]  levalbuterol (XOPENEX HFA) 45 MCG/ACT inhaler Inhale 2 puffs into the lungs every 6 (six) hours as needed for shortness of breath. 04/30/19   [provider]  meloxicam (MOBIC) 15 MG tablet Take 15 mg by mouth  daily as needed for pain. 04/21/19   [provider]  metFORMIN (GLUCOPHAGE) 500 MG tablet Take 500 mg by mouth 2 (two) times daily. 05/01/19   [provider]  metoprolol succinate (TOPROL-XL) 50 MG 24 hr tablet Take 50 mg by mouth daily. 05/01/19   [provider]  OXYGEN Inhale 2 L/min into the lungs continuous.    [provider]  PRESCRIPTION MEDICATION CPAP- At bedtime and during any times of rest (WITH OXYGEN)    [provider]  TRAVATAN Z 0.004 % SOLN ophthalmic solution Place 1 drop into both eyes in the morning and at bedtime. 11/27/18   [provider]    Inpatient Medications: Scheduled Meds: . sodium chloride flush  3 mL Intravenous Once   Continuous Infusions:  PRN Meds:   Allergies:    Allergies  Allergen Reactions  . Demerol [Meperidine] Hypertension    "Raised my blood pressure"  . Talwin [Pentazocine] Other (See Comments)    Caused hallucinations     Social History:   Social History   Socioeconomic History  . Marital status: Widowed    Spouse name: Not on file  . Number of children: Not on file  . Years of education: Not on file  . Highest education level: Not on file  Occupational History  . Occupation: retired  Tobacco Use  . Smoking status: Former Smoker    Packs/day: 0.75    Years: 40.00    Pack years: 30.00    Quit date: 1980    Years since quitting: 41.3  . Smokeless tobacco: Never Used  Substance and Sexual Activity  . Alcohol use: Not Currently    Comment: socially remotely  . Drug use: Never  . Sexual activity: Not on file  Other Topics Concern  . Not on file  Social History Narrative  . Not on file   Social Determinants of Health   Financial Resource Strain:   . Difficulty of Paying Living Expenses:   Food Insecurity:   . Worried About Programme researcher, broadcasting/film/video in the Last Year:   . Barista in the Last Year:   Transportation Needs:   . Freight forwarder (Medical):   Marland Kitchen  Lack of Transportation (Non-Medical):   Physical Activity:   . Days of Exercise per Week:   . Minutes of Exercise per Session:   Stress:   . Feeling of Stress :   Social Connections:   . Frequency of Communication with Friends and Family:   . Frequency of Social Gatherings with Friends and Family:   . Attends Religious Services:   . Active Member of Clubs or Organizations:   . Attends Banker Meetings:   Marland Kitchen Marital Status:   Intimate Partner Violence:   . Fear of Current or Ex-Partner:   . Emotionally Abused:   Marland Kitchen Physically Abused:   . Sexually Abused:     Family History:    Family  History  Problem Relation Age of Onset  . Heart attack Mother   . Heart attack Father   . Diabetes Sister   . Stroke Sister   . Diabetes Brother   . Diabetes Maternal Aunt   . Stroke Daughter   . Lung cancer Brother   . Breast cancer Sister      ROS:  Please see the history of present illness.   All other ROS reviewed and negative.     Physical Exam/Data:   Vitals:   05/30/19 2133 05/30/19 2145  BP:  (!) 133/114  Pulse:  100  Resp:  16  Temp:  98.3 F (36.8 C)  TempSrc:  Oral  SpO2: 99% 99%   No intake or output data in the 24 hours ending 05/31/19 0437 Last 3 Weights 05/18/2019 05/07/2019 05/06/2019  Weight (lbs) 157 lb 3.2 oz 155 lb 11.2 oz 193 lb  Weight (kg) 71.305 kg 70.625 kg 87.544 kg     There is no height or weight on file to calculate BMI.  General:  Well nourished, well developed, in no acute distress HEENT: normal Lymph: no adenopathy Neck: no JVD Endocrine:  No thryomegaly Vascular: No carotid bruits; DP pulses 2+ bilaterally  Cardiac:  normal S1, S2; RRR; no murmur  Lungs:  clear to auscultation bilaterally, no wheezing, rhonchi or rales  Abd: soft, nontender, no hepatomegaly  Ext: no edema Musculoskeletal:  No deformities, BUE and BLE strength normal and equal Skin: warm and dry  Neuro:  no focal abnormalities noted Psych:  Normal affect   EKG:   The EKG was personally reviewed and demonstrates:  NSR with nonspecific ST changes Telemetry:  Telemetry was personally reviewed and demonstrates:  NSR  Relevant CV Studies: TTE 05-06-19 1. Left ventricular ejection fraction, by estimation, is 65 to 70%. The  left ventricle has normal function. The left ventricle has no regional  wall motion abnormalities. There is moderate left ventricular hypertrophy  of the basal-septal segment. Left  ventricular diastolic parameters are consistent with Grade I diastolic  dysfunction (impaired relaxation).  2. Right ventricular systolic function is mildly reduced. The right  ventricular size is normal. Tricuspid regurgitation signal is inadequate  for assessing PA pressure.  3. The mitral valve is normal in structure. Mild mitral valve  regurgitation. No evidence of mitral stenosis.  4. The aortic valve is tricuspid. Aortic valve regurgitation is not  visualized. Mild aortic valve sclerosis is present, with no evidence of  aortic valve stenosis.   Laboratory Data:  High Sensitivity Troponin:   Recent Labs  Lab 05/06/19 0304 05/06/19 0525 05/30/19 2147 05/31/19 0155  TROPONINIHS 5 4 6 7      Chemistry Recent Labs  Lab 05/30/19 2147  NA 143  K 4.0  CL 105  CO2 28  GLUCOSE 109*  BUN 13  CREATININE 0.88  CALCIUM 9.4  GFRNONAA 59*  GFRAA >60  ANIONGAP 10    No results for input(s): PROT, ALBUMIN, AST, ALT, ALKPHOS, BILITOT in the last 168 hours. Hematology Recent Labs  Lab 05/30/19 2147  WBC 10.7*  RBC 4.56  HGB 11.1*  HCT 37.7  MCV 82.7  MCH 24.3*  MCHC 29.4*  RDW 16.1*  PLT 199   BNPNo results for input(s): BNP, PROBNP in the last 168 hours.  DDimer No results for input(s): DDIMER in the last 168 hours.   Radiology/Studies:  DG Chest 2 View  Result Date: 05/30/2019 CLINICAL DATA:  86 year female with chest pain. EXAM: CHEST -  2 VIEW COMPARISON:  Chest radiograph dated 05/06/2019. FINDINGS: Mild diffuse  interstitial coarsening and chronic bronchitic changes. No focal consolidation, pleural effusion, pneumothorax. Stable cardiomediastinal silhouette. No acute osseous pathology. IMPRESSION: No active cardiopulmonary disease.  No interval change. Electronically Signed   By: Anner Crete M.D.   On: 05/30/2019 21:57         Assessment and Plan:   1. NSVT: 7-second run of NSVT noted on outpatient ambulatory monitor. Pt does not think that this episode was symptomatic. Pt has h/o recent near-syncope episodes, possibly related to medication-induced bradycardia or vasovagal events, but no frank syncope. Recent unremarkable TTE w/ preserved LVEF. Ischemia eval has been scheduled, but stress test not until 06-05-19. Workup in ED unremarkable; no acute EKG changes, negative trop, lytes OK. Would keep K>4, Mag>2. Will have EP see pt in the AM per Dr. Radford Pax. Please keep NPO as we may be able to get nuclear stress test done while pt is in-house.  2. HTN/DM/OSA/dyslipidemia/COPD: cont home regimen; mgmt as per primary team    For questions or updates, please contact [place] Please consult [email or web address] for contact info under     Signed, Rudean Curt, MD, [facility]  05/31/2019 4:37 AM   [facility] Attending Addendum:  History and all data above reviewed.  Patient examined.  I agree with the findings as above.  All available labs, radiology testing, previous records reviewed. Agree with documented assessment and plan.  Heather Aguilar is an 51F well-known to me with OSA, hypertension, diabetes, GERD, hyperlipidemia, CKD III, syncope, and COPD here with NSVT on event monitor.  She was initially evaluated last month for an episode of pre-syncope that occurred when sitting at a table.  Prior to that she had been straining to have a bowel movement.  She was brought to the ED, by which time her lightheadedness had resolved.  By the time she reached the ED her lightheadedness improved but she still remained short  of breath.  She was seen in the ED on 4/12 where orthostatic vitals were negative.  Echo revealed LVEF 65-70% with grade 1 diastolic dysfunction.  While there her heart rate was in the 30s so verapamil was discontinued. Amlodipine was added to her regimen.  She reported tinnitus and had a carotid Doppler that revealed mild stenosis bilaterally.  She was seen in clinic and had an ambulatory monitor placed.  Yesterday she had an episode of feeling lightheaded around 4 PM.  It felt similar to her initial episode.  She did not receive any phone calls from the monitoring company at that time.  However later that evening she received a follow-up phone call because she had 7 seconds of NSVT.  She was completely asymptomatic.  In the ED her work-up has been unremarkable.  EKG was unchanged.  Cardiac enzymes have been negative and electrolytes within normal limits.  There have been no further arrhythmias on telemetry.  She is feeling well.  She would like to go ahead and get the [facility] which was scheduled as an outpatient.  We will have this done this morning.   Anjenette Gerbino C. Oval Linsey, MD, [facility]  05/31/2019 10:17 AM

## 2019-05-31 NOTE — Progress Notes (Signed)
RT placed patient on CPAP HS. 2L O2 bleed in needed. Patient tolerating well at this time. 

## 2019-05-31 NOTE — ED Notes (Signed)
Spoke with Eddie in Nuclear med, advised patient will be transported to 5C20 following study.

## 2019-06-01 LAB — COMPREHENSIVE METABOLIC PANEL
ALT: 12 U/L (ref 0–44)
AST: 16 U/L (ref 15–41)
Albumin: 3.4 g/dL — ABNORMAL LOW (ref 3.5–5.0)
Alkaline Phosphatase: 77 U/L (ref 38–126)
Anion gap: 7 (ref 5–15)
BUN: 11 mg/dL (ref 8–23)
CO2: 27 mmol/L (ref 22–32)
Calcium: 8.9 mg/dL (ref 8.9–10.3)
Chloride: 107 mmol/L (ref 98–111)
Creatinine, Ser: 0.75 mg/dL (ref 0.44–1.00)
GFR calc Af Amer: >60 mL/min (ref >60)
GFR calc non Af Amer: >60 mL/min (ref >60)
Glucose, Bld: 105 mg/dL — ABNORMAL HIGH (ref 70–99)
Potassium: 4.6 mmol/L (ref 3.5–5.1)
Sodium: 141 mmol/L (ref 135–145)
Total Bilirubin: 0.7 mg/dL (ref 0.3–1.2)
Total Protein: 6.9 g/dL (ref 6.5–8.1)

## 2019-06-01 LAB — CBC
HCT: 38.9 % (ref 36.0–46.0)
Hemoglobin: 11.8 g/dL — ABNORMAL LOW (ref 12.0–15.0)
MCH: 24.6 pg — ABNORMAL LOW (ref 26.0–34.0)
MCHC: 30.3 g/dL (ref 30.0–36.0)
MCV: 81.2 fL (ref 80.0–100.0)
Platelets: 208 10*3/uL (ref 150–400)
RBC: 4.79 MIL/uL (ref 3.87–5.11)
RDW: 16.1 % — ABNORMAL HIGH (ref 11.5–15.5)
WBC: 8.4 10*3/uL (ref 4.0–10.5)
nRBC: 0 % (ref 0.0–0.2)

## 2019-06-01 NOTE — Consult Note (Signed)
   [facility] Inpatient Consult   06/01/2019  Heather Aguilar June 25, 1930 [id number]   [facility] ACO Patient: Medicare Next Gen less than 30 days rehospitalization     Spoke with the patient via hospital phone regarding the benefits of [facility] Care Management services with Medicare for post hospital follow up needs for care or disease management needs. [name] that [facility] Care Management is a covered benefit of Medicare insurance in network. Review information for [facility] Care Management nurse for follow up and support and offered information to be provided with contact information.  Attempts to verify follow up number and HIPAA and patient declined at this time, attempts to explain to patient the need for verification and she declined and hung up the phone.  Patient declined services with [facility] for Care Management at this time.  Patient will have General Discharge follow up per hospital protocol.  Reached out to inpatient Transition of Care team to update on out reach and patient declined services.  For questions or referrals please contact:  Charlesetta Shanks, RN BSN CCM [facility]  [phone] business mobile phone Toll free office [phone]  Fax number: [phone] [email or web address]

## 2019-06-01 NOTE — Progress Notes (Signed)
Pricilla Handler to be D/C'd Home per MD order.  Discussed with the patient and all questions fully answered.   VSS, Skin clean, dry and intact without evidence of skin break down, no evidence of skin tears noted. IV catheter discontinued intact. Site without signs and symptoms of complications. Dressing and pressure applied.   An After Visit Summary was printed and given to the patient.    D/C education completed with patient/family including follow up instructions, medication list, d/c activities limitations if indicated, with other d/c instructions as indicated by MD - patient able to verbalize understanding, all questions fully answered.    Patient instructed to return to ED, call 911, or call MD for any changes in condition.    Patient escorted via WC, and D/C home via private car.

## 2019-06-01 NOTE — Progress Notes (Signed)
Progress Note  Patient Name: Heather Aguilar Date of Encounter: 06/01/2019  Primary Cardiologist: Chilton Si, MD   Subjective   Myoview stress test was low risk with no significant perfusion defects. Patient denies dizziness, lightheadedness. No chest pain. She has chronic sob.   Inpatient Medications    Scheduled Meds: . amLODipine  5 mg Oral Daily  . metoprolol succinate  50 mg Oral Daily   Continuous Infusions:  PRN Meds:    Vital Signs    Vitals:   05/31/19 1623 05/31/19 1852 06/01/19 0005 06/01/19 0449  BP: (!) 154/77 132/62 133/71 129/70  Pulse: 66 78 68 60  Resp: 15 16 17 17   Temp: 97.8 F (36.6 C) 98 F (36.7 C) 98.4 F (36.9 C) 98.1 F (36.7 C)  TempSrc: Oral Oral Oral Oral  SpO2: 100% 98% 93% 98%    Intake/Output Summary (Last 24 hours) at 06/01/2019 1152 Last data filed at 05/31/2019 1700 Gross per 24 hour  Intake 120 ml  Output --  Net 120 ml   Last 3 Weights 05/18/2019 05/07/2019 05/06/2019  Weight (lbs) 157 lb 3.2 oz 155 lb 11.2 oz 193 lb  Weight (kg) 71.305 kg 70.625 kg 87.544 kg      Telemetry    NSR, HR 60, no NSVT - Personally Reviewed  ECG    No new - Personally Reviewed  Physical Exam   GEN: No acute distress.   Neck: No JVD Cardiac: RRR, no murmurs, rubs, or gallops.  Respiratory: Clear to auscultation bilaterally. GI: Soft, nontender, non-distended  MS: No edema; No deformity. Neuro:  Nonfocal  Psych: Normal affect   Labs    High Sensitivity Troponin:   Recent Labs  Lab 05/06/19 0304 05/06/19 0525 05/30/19 2147 05/31/19 0155  TROPONINIHS 5 4 6 7       Chemistry Recent Labs  Lab 05/30/19 2147 06/01/19 0652  NA 143 141  K 4.0 4.6  CL 105 107  CO2 28 27  GLUCOSE 109* 105*  BUN 13 11  CREATININE 0.88 0.75  CALCIUM 9.4 8.9  PROT  --  6.9  ALBUMIN  --  3.4*  AST  --  16  ALT  --  12  ALKPHOS  --  77  BILITOT  --  0.7  GFRNONAA 59* >60  GFRAA >60 >60  ANIONGAP 10 7     Hematology Recent Labs  Lab  05/30/19 2147 06/01/19 0652  WBC 10.7* 8.4  RBC 4.56 4.79  HGB 11.1* 11.8*  HCT 37.7 38.9  MCV 82.7 81.2  MCH 24.3* 24.6*  MCHC 29.4* 30.3  RDW 16.1* 16.1*  PLT 199 208    BNPNo results for input(s): BNP, PROBNP in the last 168 hours.   DDimer No results for input(s): DDIMER in the last 168 hours.   Radiology    DG Chest 2 View  Result Date: 05/30/2019 CLINICAL DATA:  86 year female with chest pain. EXAM: CHEST - 2 VIEW COMPARISON:  Chest radiograph dated 05/06/2019. FINDINGS: Mild diffuse interstitial coarsening and chronic bronchitic changes. No focal consolidation, pleural effusion, pneumothorax. Stable cardiomediastinal silhouette. No acute osseous pathology. IMPRESSION: No active cardiopulmonary disease.  No interval change. Electronically Signed   By: Eighty-six M.D.   On: 05/30/2019 21:57   NM Myocar Multi W/Spect W/Wall Motion / EF  Result Date: 05/31/2019  There was no ST segment deviation noted during stress.  No T wave inversion was noted during stress.  This is a low risk study. There are  no significant perfusion defects seen at stress and rest. There appears to be bowel loop attenuation in the inferoseptal region.  Candee Furbish, MD   Cardiac Studies   05/06/19 1. Left ventricular ejection fraction, by estimation, is 65 to 70%. The  left ventricle has normal function. The left ventricle has no regional  wall motion abnormalities. There is moderate left ventricular hypertrophy  of the basal-septal segment. Left  ventricular diastolic parameters are consistent with Grade I diastolic  dysfunction (impaired relaxation).  2. Right ventricular systolic function is mildly reduced. The right  ventricular size is normal. Tricuspid regurgitation signal is inadequate  for assessing PA pressure.  3. The mitral valve is normal in structure. Mild mitral valve  regurgitation. No evidence of mitral stenosis.  4. The aortic valve is tricuspid. Aortic valve  regurgitation is not  visualized. Mild aortic valve sclerosis is present, with no evidence of  aortic valve stenosis.   Stress test 05/31/19  There was no ST segment deviation noted during stress.  No T wave inversion was noted during stress.  This is a low risk study. There are no significant perfusion defects seen at stress and rest. There appears to be bowel loop attenuation in the inferoseptal region.     Patient Profile     86 y.o. female with a hx of OSA, HTN, COPD on home O2, dyslipidemia, CKD stage 3, with recent episode of medication-induced bradycardia (HR in the 30s) and near-syncope (felt to be vasovagal, but pt d/c'd with ambulatory monitor)  who is being seen today for the evaluation of NSVT noted on event monitoring at the request of [facility]  Assessment & Plan    NSVT 7 second run of NSVT on outpatient heart monitor possible symptomatic with dizziness. H/o or near-syncoep episode and bradycardia to 30 bpm during her last admission (verapamil was d/c'd) - Echo with preserved EF - ED work-up unremarkable. EKG non acute. Negative trop - Stress test yesterday with no ST/Twave changes, low risk study, no significant perfusion defects - No recurrent NSVT on tele - patient has heart monitor until May 17th.  HTN - toprol-XL 50 mg , amlodipine 5mg  daily  DM - SSI per IM   For questions or updates, please contact [place] Please consult [email or web address] for contact info under        Signed, Raynie Steinhaus Ninfa Meeker, PA-C  06/01/2019, 11:52 AM

## 2019-06-01 NOTE — Discharge Summary (Signed)
Physician Discharge Summary  Heather Aguilar ZOX:[id number] DOB: [birth year] DOA: 05/30/2019  PCP: Heather Morton, MD  Admit date: 05/30/2019 Discharge date: 06/01/2019  Admitted From: Home Disposition: Home  Recommendations for Outpatient Follow-up:  1. Follow up with PCP in 1-2 weeks, cardiology as scheduled 2. Please obtain BMP/CBC in one week 3. Please follow up on the following pending results:  Discharge Condition: Able CODE STATUS: DNR Diet recommendation: Cardiac prudent diabetic diet  Brief/Interim Summary: Heather Aguilar a 86 y.o.femalewith medical history significant ofCOPD on home oxygen, OSA on CPAP, pulmonary nodule, dyslipidemia, hypertension, type 2 diabeteswho is coming to the emergency department after her event recorder show a 7 beat run of V. tach. She stated she have a brief episode of lightheadedness in the afternoon, but she denies having any symptoms with this episode. No chest pain, worsening chronic dyspnea,palpitations, diaphoresis, PND orthopnea. She occasionally gets lower extremity edema. No fever, chills, sore throat, rhinorrhea, wheezing or hemoptysis. Denies abdominal pain, diarrhea, constipation, melena or hematochezia. No dysuria, frequency or hematuria. No polyuria, polydipsia, polyphagia or blurred vision. In ED pt initial vital signs temperature 98.3 F, pulse 100, respirations 16, blood pressure 113/1 14 mmHg and O2 sat 99%. Cardiology was consulted. CBC shows a white count 10.7, hemoglobin 11.1 g/dL and platelets 811. BMP shows a glucose of 109 mg/dL, but is otherwise normal. EKG is NSR with nonspecific T wave abnormality. Troponin x2 was normal. Phosphorus 3.5 and magnesium 1.9 mg/dL. Her chest radiograph does not show any active cardiopulmonary pathology. Cardiology following - planning stress test later today. Possible further evaluation with EP given near-syncope and NSVT runs  Admitted as above with multiple episodes of presyncope with  notable SVT run prior to admission.  Unclear if these events were synchronous or related in any way, cardiology consulted evaluated and followed echocardiogram, stress test and telemetry all of which were unremarkable.  Time patient otherwise stable and agreeable for discharge home, close follow-up with PCP and cardiology in the outpatient setting as scheduled.  No medication changes during hospital stay.  Continue cardiac monitor through Jun 11, 2019 per cardiology.  Discharge Diagnoses:  Principal Problem:   Ventricular tachycardia, non-sustained (HCC) Active Problems:   Essential hypertension   Dyslipidemia   Type 2 diabetes mellitus (HCC)   COPD (chronic obstructive pulmonary disease) (HCC)   Chronic respiratory failure with hypoxia (HCC)   Stage 3a chronic kidney disease   Glaucoma    Discharge Instructions  Discharge Instructions    Call MD for:  difficulty breathing, headache or visual disturbances   Complete by: As directed    Call MD for:  extreme fatigue   Complete by: As directed    Call MD for:  hives   Complete by: As directed    Call MD for:  persistant dizziness or light-headedness   Complete by: As directed    Call MD for:  persistant nausea and vomiting   Complete by: As directed    Call MD for:  severe uncontrolled pain   Complete by: As directed    Call MD for:  temperature >100.4   Complete by: As directed    Diet - low sodium heart healthy   Complete by: As directed    Increase activity slowly   Complete by: As directed      Allergies as of 06/01/2019      Reactions   Demerol [meperidine] Hypertension   "Raised my blood pressure"   Talwin [pentazocine] Other (See Comments)  Caused hallucinations      Medication List    TAKE these medications   acetaminophen 500 MG tablet Commonly known as: TYLENOL Take 500-1,000 mg by mouth every 6 (six) hours as needed for mild pain or headache.   Alphagan P 0.15 % ophthalmic solution Generic drug:  brimonidine Place 1 drop into both eyes daily.   ALPRAZolam 0.25 MG tablet Commonly known as: XANAX Take 0.25 mg by mouth daily as needed for anxiety.   amLODipine 5 MG tablet Commonly known as: NORVASC Take 1 tablet (5 mg total) by mouth daily.   Anoro Ellipta 62.5-25 MCG/INH Aepb Generic drug: umeclidinium-vilanterol Inhale 1 puff into the lungs daily.   atorvastatin 20 MG tablet Commonly known as: LIPITOR Take 20 mg by mouth daily.   ferrous sulfate 325 (65 FE) MG tablet Take 325 mg by mouth daily with lunch.   furosemide 20 MG tablet Commonly known as: LASIX Take 20 mg by mouth daily as needed (for swelling of the ankles).   ipratropium-albuterol 0.5-2.5 (3) MG/3ML Soln Commonly known as: DUONEB Take 3 mLs by nebulization 2 (two) times daily.   lansoprazole 30 MG capsule Commonly known as: PREVACID Take 30 mg by mouth daily before breakfast.   levalbuterol 45 MCG/ACT inhaler Commonly known as: XOPENEX HFA Inhale 2 puffs into the lungs every 6 (six) hours as needed for shortness of breath.   meloxicam 15 MG tablet Commonly known as: MOBIC Take 15 mg by mouth daily as needed for pain.   metFORMIN 500 MG tablet Commonly known as: GLUCOPHAGE Take 500 mg by mouth 2 (two) times daily.   metoprolol succinate 50 MG 24 hr tablet Commonly known as: TOPROL-XL Take 50 mg by mouth daily.   OXYGEN Inhale 2 L/min into the lungs continuous.   PRESCRIPTION MEDICATION CPAP- At bedtime and during any times of rest (WITH OXYGEN)   Travatan Z 0.004 % Soln ophthalmic solution Generic drug: Travoprost (BAK Free) Place 1 drop into both eyes in the morning and at bedtime.      Follow-up Information    Aguilar, Heather Coup, MD Follow up.   Specialty: Family Medicine Contact information: [address] [place] [address] [phone]        Chilton Si, MD .   Specialty: Cardiology Contact information: [address] [place]  [address] [phone]          Allergies  Allergen Reactions  . Demerol [Meperidine] Hypertension    "Raised my blood pressure"  . Talwin [Pentazocine] Other (See Comments)    Caused hallucinations     Consultations: Cardiology  Procedures/Studies: DG Chest 2 View  Result Date: 05/30/2019 CLINICAL DATA:  86 year female with chest pain. EXAM: CHEST - 2 VIEW COMPARISON:  Chest radiograph dated 05/06/2019. FINDINGS: Mild diffuse interstitial coarsening and chronic bronchitic changes. No focal consolidation, pleural effusion, pneumothorax. Stable cardiomediastinal silhouette. No acute osseous pathology. IMPRESSION: No active cardiopulmonary disease.  No interval change. Electronically Signed   By: Elgie Collard M.D.   On: 05/30/2019 21:57   DG Chest 2 View  Result Date: 05/05/2019 CLINICAL DATA:  Shortness of breath. EXAM: CHEST - 2 VIEW COMPARISON:  March 26, 2017 FINDINGS: Mild to moderate severity diffuse chronic appearing increased interstitial lung markings are seen. There is no evidence of acute infiltrate, pleural effusion or pneumothorax. The heart size and mediastinal contours are within normal limits. The visualized skeletal structures are unremarkable. IMPRESSION: Chronic-appearing increased interstitial lung markings without evidence of acute or  active cardiopulmonary disease. Electronically Signed   By: Aram Candelahaddeus  Houston M.D.   On: 05/05/2019 23:33   CT Angio Chest PE W and/or Wo Contrast  Result Date: 05/06/2019 CLINICAL DATA:  Increased short of breath. EXAM: CT ANGIOGRAPHY CHEST WITH CONTRAST TECHNIQUE: Multidetector CT imaging of the chest was performed using the standard protocol during bolus administration of intravenous contrast. Multiplanar CT image reconstructions and MIPs were obtained to evaluate the vascular anatomy. CONTRAST:  100mL OMNIPAQUE IOHEXOL 350 MG/ML SOLN COMPARISON:  None. FINDINGS: Cardiovascular: No filling defects within the pulmonary arteries  to suggest acute pulmonary embolism. Mediastinum/Nodes: No axillary adenopathy. Bulky LEFT supraclavicular mass measuring 4.1 cm (image 1/5) extends into the thoracic inlet. This appears to originate from the thyroid gland. ). Nodular component extends posterior to the trachea above the carina measuring 2.2 cm. exam. Findings similar to CT 12/06/2012 Lungs/Pleura: Extensive centrilobular emphysema the upper lobes. No suspicious nodularity. Airspace disease. No pulmonary infarction. Small focus consolidation LEFT lower lobe measuring 2.3 cm is increased from 1.1 cm on remote comparison CT. Limited view of the liver, kidneys, pancreas are unremarkable. Normal adrenal glands. Upper Abdomen: Limited view of the liver, kidneys, pancreas are unremarkable. Normal adrenal glands. Musculoskeletal: No aggressive osseous lesion. Review of the MIP images confirms the above findings. IMPRESSION: 1. No evidence acute pulmonary embolism. 2. Extensive centrilobular emphysema. 3. Small focus consolidation in the LEFT lower lobe is increased in size from CT 2014. Favor benign finding. Consider follow-up chest CT in 3 to 6 months. 4. 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 Electronically Signed   By: Genevive BiStewart  Edmunds M.D.   On: 05/06/2019 05:28   NM Myocar Multi W/Spect W/Wall Motion / EF  Result Date: 05/31/2019  There was no ST segment deviation noted during stress.  No T wave inversion was noted during stress.  This is a low risk study. There are no significant perfusion defects seen at stress and rest. There appears to be bowel loop attenuation in the inferoseptal region.  Donato SchultzMark Skains, MD  ECHOCARDIOGRAM COMPLETE  Result Date: 05/06/2019    ECHOCARDIOGRAM REPORT   Patient Name:   Heather Aguilar Date of Exam: 05/06/2019 Medical Rec #:  [id number]    Height: Accession #:    [id number]   Weight: Date of Birth:  April 10, 1930    BSA: Patient Age:    88 years     BP:           141/83  mmHg Patient Gender: F            HR:           91 bpm. Exam Location:  Inpatient Procedure: 2D Echo, Cardiac Doppler and Color Doppler Indications:    Syncope  History:        Patient has no prior history of Echocardiogram examinations.                 COPD, Signs/Symptoms:Shortness of Breath; Risk Factors:Diabetes,                 Hypertension, Dyslipidemia and Sleep Apnea.  Sonographer:    Lavenia AtlasBrooke Strickland Referring Phys: [id number] JENNIFER YATES IMPRESSIONS  1. Left ventricular ejection fraction, by estimation, is 65 to 70%. The left ventricle has normal function. The left ventricle has no regional wall motion abnormalities. There is moderate left ventricular hypertrophy of the basal-septal segment. Left ventricular diastolic parameters are consistent with Grade I diastolic dysfunction (impaired relaxation).  2. Right ventricular  systolic function is mildly reduced. The right ventricular size is normal. Tricuspid regurgitation signal is inadequate for assessing PA pressure.  3. The mitral valve is normal in structure. Mild mitral valve regurgitation. No evidence of mitral stenosis.  4. The aortic valve is tricuspid. Aortic valve regurgitation is not visualized. Mild aortic valve sclerosis is present, with no evidence of aortic valve stenosis. FINDINGS  Left Ventricle: Left ventricular ejection fraction, by estimation, is 65 to 70%. The left ventricle has normal function. The left ventricle has no regional wall motion abnormalities. The left ventricular internal cavity size was normal in size. There is  moderate left ventricular hypertrophy of the basal-septal segment. Left ventricular diastolic parameters are consistent with Grade I diastolic dysfunction (impaired relaxation). Normal left ventricular filling pressure. Right Ventricle: The right ventricular size is normal. No increase in right ventricular wall thickness. Right ventricular systolic function is mildly reduced. Tricuspid regurgitation signal is  inadequate for assessing PA pressure. Left Atrium: Left atrial size was normal in size. Right Atrium: Right atrial size was normal in size. Pericardium: There is no evidence of pericardial effusion. Mitral Valve: The mitral valve is normal in structure. Normal mobility of the mitral valve leaflets. Mild mitral valve regurgitation. No evidence of mitral valve stenosis. Tricuspid Valve: The tricuspid valve is normal in structure. Tricuspid valve regurgitation is not demonstrated. No evidence of tricuspid stenosis. Aortic Valve: The aortic valve is tricuspid. Aortic valve regurgitation is not visualized. Mild aortic valve sclerosis is present, with no evidence of aortic valve stenosis. Pulmonic Valve: The pulmonic valve was normal in structure. Pulmonic valve regurgitation is trivial. No evidence of pulmonic stenosis. Aorta: The aortic root is normal in size and structure. Venous: The inferior vena cava was not well visualized. IAS/Shunts: No atrial level shunt detected by color flow Doppler.  LEFT VENTRICLE PLAX 2D LVIDd:         3.73 cm  Diastology LVIDs:         2.67 cm  LV e' lateral:   5.44 cm/s LV PW:         1.20 cm  LV E/e' lateral: 10.3 LV IVS:        1.30 cm  LV e' medial:    4.57 cm/s LVOT diam:     1.80 cm  LV E/e' medial:  12.3 LV SV:         48 LVOT Area:     2.54 cm  RIGHT VENTRICLE RV Basal diam:  2.24 cm RV S prime:     18.60 cm/s TAPSE (M-mode): 1.6 cm LEFT ATRIUM             RIGHT ATRIUM LA diam:        4.00 cm RA Area:     8.06 cm LA Vol (A2C):   23.5 ml RA Volume:   13.60 ml LA Vol (A4C):   29.8 ml LA Biplane Vol: 27.7 ml  AORTIC VALVE LVOT Vmax:   96.80 cm/s LVOT Vmean:  61.800 cm/s LVOT VTI:    0.189 m  AORTA Ao Root diam: 2.70 cm MITRAL VALVE MV Area (PHT): 7.37 cm    SHUNTS MV Decel Time: 103 msec    Systemic VTI:  0.19 m MV E velocity: 56.30 cm/s  Systemic Diam: 1.80 cm MV A velocity: 74.70 cm/s MV E/A ratio:  0.75 Armanda Magic MD Electronically signed by Armanda Magic MD Signature  Date/Time: 05/06/2019/2:16:48 PM    Final    VAS US CAROTID  Result Date: 05/08/2019 Carotid  Arterial Duplex Study Indications: Syncope. Limitations  Today's exam was limited due to the body habitus of the patient and              patient anatomy. Performing Technologist: Gertie Fey MHA, RDMS, RVT, RDCS  Examination Guidelines: A complete evaluation includes B-mode imaging, spectral Doppler, color Doppler, and power Doppler as needed of all accessible portions of each vessel. Bilateral testing is considered an integral part of a complete examination. Limited examinations for reoccurring indications may be performed as noted.  Right Carotid Findings: +----------+--------+--------+--------+--------------------------+--------+           PSV cm/sEDV cm/sStenosisPlaque Description        Comments +----------+--------+--------+--------+--------------------------+--------+ CCA Prox  73      12                                                 +----------+--------+--------+--------+--------------------------+--------+ CCA Distal56      15                                                 +----------+--------+--------+--------+--------------------------+--------+ ICA Prox  46      13                                                 +----------+--------+--------+--------+--------------------------+--------+ ICA Distal86      23                                                 +----------+--------+--------+--------+--------------------------+--------+ ECA       49      8               irregular and heterogenous         +----------+--------+--------+--------+--------------------------+--------+ +----------+--------+-------+----------------+-------------------+           PSV cm/sEDV cmsDescribe        Arm Pressure (mmHG) +----------+--------+-------+----------------+-------------------+ [id number]            Multiphasic, WNL                     +----------+--------+-------+----------------+-------------------+ +---------+--------+--+--------+--+---------+ VertebralPSV cm/s72EDV cm/s12Antegrade +---------+--------+--+--------+--+---------+  Left Carotid Findings: +----------+--------+--------+--------+------------------+--------+           PSV cm/sEDV cm/sStenosisPlaque DescriptionComments +----------+--------+--------+--------+------------------+--------+ CCA Prox  65      15                                         +----------+--------+--------+--------+------------------+--------+ CCA Distal65      16                                         +----------+--------+--------+--------+------------------+--------+ ICA Prox  74      15                                         +----------+--------+--------+--------+------------------+--------+  ICA Distal69      24                                         +----------+--------+--------+--------+------------------+--------+ ECA       97      8                                          +----------+--------+--------+--------+------------------+--------+ +----------+--------+--------+----------------+-------------------+           PSV cm/sEDV cm/sDescribe        Arm Pressure (mmHG) +----------+--------+--------+----------------+-------------------+ [id number]              Multiphasic, WNL                    +----------+--------+--------+----------------+-------------------+ +---------+--------+--+--------+--+---------+ VertebralPSV cm/s65EDV cm/s13Antegrade +---------+--------+--+--------+--+---------+   Summary: Right Carotid: Velocities in the right ICA are consistent with a 1-39% stenosis. Left Carotid: Velocities in the left ICA are consistent with a 1-39% stenosis. Vertebrals:  Bilateral vertebral arteries demonstrate antegrade flow. Subclavians: Normal flow hemodynamics were seen in bilateral subclavian              arteries. Incidental finding:  the left thyroid has internal vascularity and multiple heterogenous areas; etiology unknown. Further imaging may be warranted if clinically indicated. *See table(s) above for measurements and observations.  Electronically signed by Antony Contras MD on 05/08/2019 at 11:38:16 AM.    Final      Subjective: No acute issues or events overnight, feels quite well, denies chest pain, shortness of breath, nausea, vomiting, diarrhea, constipation, headache, fevers, chills, syncope, presyncope, orthostatic dizziness or exertional dyspnea.   Discharge Exam: Vitals:   06/01/19 0449 06/01/19 1242  BP: 129/70 (!) 141/52  Pulse: 60 75  Resp: 17 18  Temp: 98.1 F (36.7 C) 98 F (36.7 C)  SpO2: 98% 100%   Vitals:   05/31/19 1852 06/01/19 0005 06/01/19 0449 06/01/19 1242  BP: 132/62 133/71 129/70 (!) 141/52  Pulse: 78 68 60 75  Resp: 16 17 17 18   Temp: 98 F (36.7 C) 98.4 F (36.9 C) 98.1 F (36.7 C) 98 F (36.7 C)  TempSrc: Oral Oral Oral Oral  SpO2: 98% 93% 98% 100%    General: Pt is alert, awake, not in acute distress Cardiovascular: RRR, S1/S2 +, no rubs, no gallops Respiratory: CTA bilaterally, no wheezing, no rhonchi Abdominal: Soft, NT, ND, bowel sounds + Extremities: no edema, no cyanosis    The results of significant diagnostics from this hospitalization (including imaging, microbiology, ancillary and laboratory) are listed below for reference.     Microbiology: Recent Results (from the past 240 hour(s))  Respiratory Panel by RT PCR (Flu A&B, Covid) - Nasopharyngeal Swab     Status: None   Collection Time: 05/31/19  7:26 AM   Specimen: Nasopharyngeal Swab  Result Value Ref Range Status   SARS Coronavirus 2 by RT PCR NEGATIVE NEGATIVE Final    Comment: (NOTE) SARS-CoV-2 target nucleic acids are NOT DETECTED. The SARS-CoV-2 RNA is generally detectable in upper respiratoy specimens during the acute phase of infection. The lowest concentration of SARS-CoV-2 viral copies this  assay can detect is 131 copies/mL. A negative result does not preclude SARS-Cov-2 infection and should not be used as the sole basis for treatment or other patient management decisions.  A negative result may occur with  improper specimen collection/handling, submission of specimen other than nasopharyngeal swab, presence of viral mutation(s) within the areas targeted by this assay, and inadequate number of viral copies (<131 copies/mL). A negative result must be combined with clinical observations, patient history, and epidemiological information. The expected result is Negative. Fact Sheet for Patients:  https://www.moore.com/ Fact Sheet for Healthcare Providers:  https://www.young.biz/ This test is not yet ap proved or cleared by the [place] FDA and  has been authorized for detection and/or diagnosis of SARS-CoV-2 by FDA under an Emergency Use Authorization (EUA). This EUA will remain  in effect (meaning this test can be used) for the duration of the COVID-19 declaration under Section 564(b)(1) of the Act, 21 U.S.C. section 360bbb-3(b)(1), unless the authorization is terminated or revoked sooner.    Influenza A by PCR NEGATIVE NEGATIVE Final   Influenza B by PCR NEGATIVE NEGATIVE Final    Comment: (NOTE) The Xpert Xpress SARS-CoV-2/FLU/RSV assay is intended as an aid in  the diagnosis of influenza from Nasopharyngeal swab specimens and  should not be used as a sole basis for treatment. Nasal washings and  aspirates are unacceptable for Xpert Xpress SARS-CoV-2/FLU/RSV  testing. Fact Sheet for Patients: https://www.moore.com/ Fact Sheet for Healthcare Providers: https://www.young.biz/ This test is not yet approved or cleared by the [place] FDA and  has been authorized for detection and/or diagnosis of SARS-CoV-2 by  FDA under an Emergency Use Authorization (EUA). This EUA will remain  in  effect (meaning this test can be used) for the duration of the  Covid-19 declaration under Section 564(b)(1) of the Act, 21  U.S.C. section 360bbb-3(b)(1), unless the authorization is  terminated or revoked. Performed at [facility], [address] [place], [place] [address]      Labs: BNP (last 3 results) Recent Labs    05/05/19 2225  BNP 108.1*   Basic Metabolic Panel: Recent Labs  Lab 05/30/19 2147 05/31/19 0155 06/01/19 0652  NA 143  --  141  K 4.0  --  4.6  CL 105  --  107  CO2 28  --  27  GLUCOSE 109*  --  105*  BUN 13  --  11  CREATININE 0.88  --  0.75  CALCIUM 9.4  --  8.9  MG  --  1.9  --   PHOS  --  3.5  --    Liver Function Tests: Recent Labs  Lab 06/01/19 0652  AST 16  ALT 12  ALKPHOS 77  BILITOT 0.7  PROT 6.9  ALBUMIN 3.4*   No results for input(s): LIPASE, AMYLASE in the last 168 hours. No results for input(s): AMMONIA in the last 168 hours. CBC: Recent Labs  Lab 05/30/19 2147 06/01/19 0652  WBC 10.7* 8.4  HGB 11.1* 11.8*  HCT 37.7 38.9  MCV 82.7 81.2  PLT 199 208   Cardiac Enzymes: No results for input(s): CKTOTAL, CKMB, CKMBINDEX, TROPONINI in the last 168 hours. BNP: Invalid input(s): POCBNP CBG: No results for input(s): GLUCAP in the last 168 hours. D-Dimer No results for input(s): DDIMER in the last 72 hours. Hgb A1c No results for input(s): HGBA1C in the last 72 hours. Lipid Profile No results for input(s): CHOL, HDL, LDLCALC, TRIG, CHOLHDL, LDLDIRECT in the last 72 hours. Thyroid function studies No results for input(s): TSH, T4TOTAL, T3FREE, THYROIDAB in the last 72 hours.  Invalid input(s): FREET3 Anemia work up No results for input(s): VITAMINB12, FOLATE, FERRITIN, TIBC, IRON,  RETICCTPCT in the last 72 hours. Urinalysis No results found for: COLORURINE, APPEARANCEUR, LABSPEC, PHURINE, GLUCOSEU, HGBUR, BILIRUBINUR, KETONESUR, PROTEINUR, UROBILINOGEN, NITRITE, LEUKOCYTESUR Sepsis Labs Invalid input(s):  PROCALCITONIN,  WBC,  LACTICIDVEN Microbiology Recent Results (from the past 240 hour(s))  Respiratory Panel by RT PCR (Flu A&B, Covid) - Nasopharyngeal Swab     Status: None   Collection Time: 05/31/19  7:26 AM   Specimen: Nasopharyngeal Swab  Result Value Ref Range Status   SARS Coronavirus 2 by RT PCR NEGATIVE NEGATIVE Final    Comment: (NOTE) SARS-CoV-2 target nucleic acids are NOT DETECTED. The SARS-CoV-2 RNA is generally detectable in upper respiratoy specimens during the acute phase of infection. The lowest concentration of SARS-CoV-2 viral copies this assay can detect is 131 copies/mL. A negative result does not preclude SARS-Cov-2 infection and should not be used as the sole basis for treatment or other patient management decisions. A negative result may occur with  improper specimen collection/handling, submission of specimen other than nasopharyngeal swab, presence of viral mutation(s) within the areas targeted by this assay, and inadequate number of viral copies (<131 copies/mL). A negative result must be combined with clinical observations, patient history, and epidemiological information. The expected result is Negative. Fact Sheet for Patients:  https://www.moore.com/ Fact Sheet for Healthcare Providers:  https://www.young.biz/ This test is not yet ap proved or cleared by the [place] FDA and  has been authorized for detection and/or diagnosis of SARS-CoV-2 by FDA under an Emergency Use Authorization (EUA). This EUA will remain  in effect (meaning this test can be used) for the duration of the COVID-19 declaration under Section 564(b)(1) of the Act, 21 U.S.C. section 360bbb-3(b)(1), unless the authorization is terminated or revoked sooner.    Influenza A by PCR NEGATIVE NEGATIVE Final   Influenza B by PCR NEGATIVE NEGATIVE Final    Comment: (NOTE) The Xpert Xpress SARS-CoV-2/FLU/RSV assay is intended as an aid in  the  diagnosis of influenza from Nasopharyngeal swab specimens and  should not be used as a sole basis for treatment. Nasal washings and  aspirates are unacceptable for Xpert Xpress SARS-CoV-2/FLU/RSV  testing. Fact Sheet for Patients: https://www.moore.com/ Fact Sheet for Healthcare Providers: https://www.young.biz/ This test is not yet approved or cleared by the [place] FDA and  has been authorized for detection and/or diagnosis of SARS-CoV-2 by  FDA under an Emergency Use Authorization (EUA). This EUA will remain  in effect (meaning this test can be used) for the duration of the  Covid-19 declaration under Section 564(b)(1) of the Act, 21  U.S.C. section 360bbb-3(b)(1), unless the authorization is  terminated or revoked. Performed at [facility], [address] [place], [place] [address]      Time coordinating discharge: Over 30 minutes  SIGNED:   Azucena Fallen, DO [facility] 06/01/2019, 2:25 PM Pager   If 7PM-7AM, please contact night-coverage [email or web address]

## 2019-06-01 NOTE — Plan of Care (Signed)
Pt understanding of discharge instructions  

## 2019-06-05 ENCOUNTER — Telehealth: Payer: Self-pay | Admitting: Physician Assistant

## 2019-06-05 ENCOUNTER — Telehealth: Payer: Self-pay | Admitting: Internal Medicine

## 2019-06-05 ENCOUNTER — Ambulatory Visit (HOSPITAL_COMMUNITY)
Admission: RE | Admit: 2019-06-05 | Payer: Medicare Other | Source: Ambulatory Visit | Attending: Cardiovascular Disease | Admitting: Cardiovascular Disease

## 2019-06-05 NOTE — Telephone Encounter (Signed)
Paged by Jonny Ruiz of [employer] [phone]) for 9 beats run of NSVT around 4:29 Central standard time (5:29AM EST). HR during the episode of 162. This terminated into normal sinus rhythm with HR 67. Will forward to Dr. Duke Salvia to review and consider adjustment of her metoprolol dose.  Also note patient has myoview today that was ordered on 4/23, however patient was recently admitted and had a myoview on 5/6. Will attempt to cancel the duplicate today and inform the patient not to come for the duplicate stress test.   Myoview 05/31/2019  There was no ST segment deviation noted during stress.  No T wave inversion was noted during stress.  This is a low risk study. There are no significant perfusion defects seen at stress and rest. There appears to be bowel loop attenuation in the inferoseptal region.

## 2019-06-05 NOTE — Telephone Encounter (Signed)
Received a call that Ms. Heather Aguilar had a 10 beat run of NSVT. She was asymptomatic during the event. Will update Dr. Brynda Greathouse.   Feliberto Harts, MD Cardiology [facility]

## 2019-06-07 DIAGNOSIS — I479 Paroxysmal tachycardia, unspecified: Secondary | ICD-10-CM | POA: Diagnosis not present

## 2019-06-07 DIAGNOSIS — R55 Syncope and collapse: Secondary | ICD-10-CM | POA: Diagnosis not present

## 2019-06-07 DIAGNOSIS — I110 Hypertensive heart disease with heart failure: Secondary | ICD-10-CM | POA: Diagnosis not present

## 2019-06-07 DIAGNOSIS — I10 Essential (primary) hypertension: Secondary | ICD-10-CM | POA: Diagnosis not present

## 2019-06-08 ENCOUNTER — Telehealth: Payer: Self-pay | Admitting: Cardiovascular Disease

## 2019-06-08 NOTE — Telephone Encounter (Signed)
Patient's sister call on-call paging service with concerns about shortness of breath.   Per report, patient developed acute onset shortness of breath and "flushing" earlier this evening. They called [employer] (is wearing Zio patch) but was not told any results.  At the time of my call, continues to feel, and is concerned by, dyspnea.   Recommended that she bring the patient into ER or urgent care for evaluation of shortness of breath. Have not been alerted by [employer] about any EKG abnormalities.   Lauren K. Charna Busman, MD

## 2019-06-08 NOTE — Telephone Encounter (Signed)
Thank you :)

## 2019-06-10 ENCOUNTER — Encounter: Payer: Self-pay | Admitting: Cardiovascular Disease

## 2019-06-19 DIAGNOSIS — H401132 Primary open-angle glaucoma, bilateral, moderate stage: Secondary | ICD-10-CM | POA: Diagnosis not present

## 2019-06-19 DIAGNOSIS — E119 Type 2 diabetes mellitus without complications: Secondary | ICD-10-CM | POA: Diagnosis not present

## 2019-06-19 DIAGNOSIS — Z961 Presence of intraocular lens: Secondary | ICD-10-CM | POA: Diagnosis not present

## 2019-07-06 NOTE — Progress Notes (Signed)
Cardiology Office Note:    Date:  07/12/2019   ID:  SARABELLE GENSON, DOB [birth year], MRN [id number]  PCP:  Street, Sharon Mt, MD  Cardiologist:  Skeet Latch, MD  Electrophysiologist:  None   Referring MD: Street, Sharon Mt, *   Chief Complaint: follow-up of near syncope and non-sustained VT  History of Present Illness:    Heather Aguilar is a 86 y.o. female with a history of hypertension, hyperlipidemia, diabetes, CKD stage III, COPD, obstructive sleep apnea, and GERD who is followed by Dr. Oval Linsey and presents today for follow-up.   Patient first seen by Cardiology during admission for near syncope in 04/2019. Near syncope occurred 4 days prior to presentation while sitting at table and she had associated palpitations, lightheadedness, and shortness of breath with this. Ten minutes prior to this episode she was in the restroom straining with constipation so felt to possibly be vasovagal in nature. Heart rates were noted to drop into the 30's overnight in the hospital so Verapamil was discontinued and patient was started on Amlodipine instead. This episode of bradycardia was felt to likely be due to the fact that patient has obstructive sleep apnea and was not using CPAP while admitted. Otherwise, work-up was unremarkable. D-dimer and high-sensitivity troponin negative. Echo showed LVEF of 65-70% with moderate LVH of the basal-septal segment and grade 1 diastolic dysfunction. Carotid dopplers only showed mild disease (1-39% stenosis) bilaterally. 30-Day Event Monitor was ordered for further evaluation. She was seen by Dr. Oval Linsey in the office on 05/18/2019 for follow-up at which time patient reported atypical episodes of chest tightness when standing for prolonged periods of time but denied any episodes of recurrent near syncope. Myoview was ordered for further evaluation and patient was instructed to continuing wearing outpatient monitor. Before Myoview could be completed, she was  readmitted in 05/2019 after being notified by [employer] of a 7 second run of VT. Patient had a brief episode of lightheadedness earlier in the day but at the time of the VT was asymptomatic. Myoview was performed during admission and was negative for ischemia. She had no recurrent NSVT during admission so was discharged. Since being discharged, full Monitor results have returned and showed short runs of non-sustained VT with the longest run being that 7 second episode she had prior to admission. Also had occasional PVCs but nothing to explain near syncopal episode.   Patient presents today for follow-up. Here alone. Patient reports she is doing well. No recurrent episodes of lightheadedness, dizziness, or near syncope. No palpitations. No real chest pain but still describes sensations of "tiredness" in her back when standing for long periods of time. She has chronic shortness of breath with her COPD and his on home O2 but this is stable. She also wears CPAP machine at night. No orthopnea or PND. She occasionally has swelling of left lower extremity that improves with compression stocking/sock. She states this is stable and is not new.   Past Medical History:  Diagnosis Date  . COPD (chronic obstructive pulmonary disease) ([place])    on home O2  . Diabetes mellitus ([place])   . Dyslipidemia   . Essential hypertension   . OSA (obstructive sleep apnea)    wears CPAP  . Pulmonary nodule   . Thyroid goiter     Past Surgical History:  Procedure Laterality Date  . ABDOMINAL HYSTERECTOMY    . COLON SURGERY      Current Medications: Current Meds  Medication Sig  . acetaminophen (TYLENOL) 500  MG tablet Take 500-1,000 mg by mouth every 6 (six) hours as needed for mild pain or headache.  . ALPHAGAN P 0.15 % ophthalmic solution Place 1 drop into both eyes daily.  Marland Kitchen ALPRAZolam (XANAX) 0.25 MG tablet Take 0.25 mg by mouth daily as needed for anxiety.  Marland Kitchen amLODipine (NORVASC) 5 MG tablet Take 1 tablet (5 mg  total) by mouth daily.  Ailene Ards ELLIPTA 62.5-25 MCG/INH AEPB Inhale 1 puff into the lungs daily.  Marland Kitchen atorvastatin (LIPITOR) 20 MG tablet Take 20 mg by mouth daily.  . ferrous sulfate 325 (65 FE) MG tablet Take 325 mg by mouth daily with lunch.  . furosemide (LASIX) 20 MG tablet Take 20 mg by mouth daily as needed (for swelling of the ankles).   Marland Kitchen ipratropium-albuterol (DUONEB) 0.5-2.5 (3) MG/3ML SOLN Take 3 mLs by nebulization 2 (two) times daily.  . lansoprazole (PREVACID) 30 MG capsule Take 30 mg by mouth daily before breakfast.  . levalbuterol (XOPENEX HFA) 45 MCG/ACT inhaler Inhale 2 puffs into the lungs every 6 (six) hours as needed for shortness of breath.  . meloxicam (MOBIC) 15 MG tablet Take 15 mg by mouth daily as needed for pain.  . metFORMIN (GLUCOPHAGE) 500 MG tablet Take 500 mg by mouth 2 (two) times daily.  . metoprolol succinate (TOPROL-XL) 50 MG 24 hr tablet Take 50 mg by mouth daily.  . OXYGEN Inhale 2 L/min into the lungs continuous.  Marland Kitchen PRESCRIPTION MEDICATION CPAP- At bedtime and during any times of rest (WITH OXYGEN)  . TRAVATAN Z 0.004 % SOLN ophthalmic solution Place 1 drop into both eyes in the morning and at bedtime.     Allergies:   Demerol [meperidine] and Talwin [pentazocine]   Social History   Socioeconomic History  . Marital status: Widowed    Spouse name: Not on file  . Number of children: Not on file  . Years of education: Not on file  . Highest education level: Not on file  Occupational History  . Occupation: retired  Tobacco Use  . Smoking status: Former Smoker    Packs/day: 0.75    Years: 40.00    Pack years: 30.00    Quit date: 1980    Years since quitting: 41.4  . Smokeless tobacco: Never Used  Substance and Sexual Activity  . Alcohol use: Not Currently    Comment: socially remotely  . Drug use: Never  . Sexual activity: Not on file  Other Topics Concern  . Not on file  Social History Narrative  . Not on file   Social Determinants of  Health   Financial Resource Strain:   . Difficulty of Paying Living Expenses:   Food Insecurity:   . Worried About Programme researcher, broadcasting/film/video in the Last Year:   . Barista in the Last Year:   Transportation Needs:   . Freight forwarder (Medical):   Marland Kitchen Lack of Transportation (Non-Medical):   Physical Activity:   . Days of Exercise per Week:   . Minutes of Exercise per Session:   Stress:   . Feeling of Stress :   Social Connections:   . Frequency of Communication with Friends and Family:   . Frequency of Social Gatherings with Friends and Family:   . Attends Religious Services:   . Active Member of Clubs or Organizations:   . Attends Banker Meetings:   Marland Kitchen Marital Status:      Family History: The patient's family history includes Breast  cancer in her sister; Diabetes in her brother, maternal aunt, and sister; Heart attack in her father and mother; Lung cancer in her brother; Stroke in her daughter and sister.  ROS:   Please see the history of present illness.     EKGs/Labs/Other Studies Reviewed:    The following studies were reviewed today:  Echocardiogram 05/06/2019: Impressions: 1. Left ventricular ejection fraction, by estimation, is 65 to 70%. The  left ventricle has normal function. The left ventricle has no regional  wall motion abnormalities. There is moderate left ventricular hypertrophy  of the basal-septal segment. Left  ventricular diastolic parameters are consistent with Grade I diastolic  dysfunction (impaired relaxation).  2. Right ventricular systolic function is mildly reduced. The right  ventricular size is normal. Tricuspid regurgitation signal is inadequate  for assessing PA pressure.  3. The mitral valve is normal in structure. Mild mitral valve  regurgitation. No evidence of mitral stenosis.  4. The aortic valve is tricuspid. Aortic valve regurgitation is not  visualized. Mild aortic valve sclerosis is present, with no evidence of   aortic valve stenosis. _______________  Carotid Dopplers 05/07/2019: Summary:  - Right Carotid: Velocities in the right ICA are consistent with a 1-39%  stenosis.  - Left Carotid: Velocities in the left ICA are consistent with a 1-39%  stenosis.  - Vertebrals: Bilateral vertebral arteries demonstrate antegrade flow.  - Subclavians: Normal flow hemodynamics were seen in bilateral subclavian arteries.   Incidental finding: the left thyroid has internal vascularity and multiple  heterogenous areas; etiology unknown. Further imaging may be warranted if clinically indicated. _______________  Steffanie Dunn 05/31/2019:  There was no ST segment deviation noted during stress.  No T wave inversion was noted during stress.  This is a low risk study. There are no significant perfusion defects seen at stress and rest. There appears to be bowel loop attenuation in the inferoseptal region. _______________  30-Day Event Monitor 05/12/2019 to 06/10/2019: Quality: Fair.  Baseline artifact. Predominant rhythm: sinus rhythm Average heart rate: 80 bpm Max heart rate: 133 bpm Min heart rate: 50 bpm Pauses >2.5 seconds: none  Multiple short runs of VT (up to 7 seconds).  Most episodes a rate around 170 bpm.  One episode 245 bpm (10 beats). Occasional PVCs.  EKG:  EKG not ordered today.   Recent Labs: 05/05/2019: B Natriuretic Peptide 108.1 05/06/2019: TSH 0.024 05/31/2019: Magnesium 1.9 06/01/2019: ALT 12; BUN 11; Creatinine, Ser 0.75; Hemoglobin 11.8; Platelets 208; Potassium 4.6; Sodium 141  Recent Lipid Panel No results found for: CHOL, TRIG, HDL, CHOLHDL, VLDL, LDLCALC, LDLDIRECT  Physical Exam:    Vital Signs: BP 128/62   Pulse 73   Ht 5\' 2"  (1.575 m)   Wt 156 lb 12.8 oz (71.1 kg)   LMP  (LMP Unknown)   SpO2 98%   BMI 28.68 kg/m     Wt Readings from Last 3 Encounters:  07/12/19 156 lb 12.8 oz (71.1 kg)  05/18/19 157 lb 3.2 oz (71.3 kg)  05/07/19 155 lb 11.2 oz (70.6 kg)      General: 86 y.o. female in no acute distress. HEENT: Normocephalic and atraumatic. Sclera clear.  Neck: Supple. No carotid bruits. No JVD. Heart: RRR. Distinct S1 and S2. No murmurs, gallops, or rubs. Radial pulses 2+ and equal bilaterally. Lungs: On chronic home O2. No increased work of breathing. Clear to ausculation bilaterally. No wheezes, rhonchi, or rales.  Abdomen: Soft, non-distended, and non-tender to palpation.  Extremities: No lower extremity edema.  Skin: Warm and dry. Neuro: Alert and oriented x3. No focal deficits. Psych: Normal affect. Responds appropriately.   Assessment:    1. History of near syncope   2. Non-sustained ventricular tachycardia (HCC)   3. Atypical chest pain   4. Essential hypertension   5. Hyperlipidemia, unspecified hyperlipidemia type     Plan:    Near Syncope / Non-Sustained VT Unclear exactly what caused patient's episode of near syncope in 04/2019. Felt to possibly be vasovagal in nature as patient had been constipated and straining to have a bowel movement prior to episode. Some bradycardia with rates in the 30's noted on telemetry following event but this was felt to likely be due to patient's obstructive sleep apnea given patient did not have her CPAP in the hospital. She has now completed a 30-Day Event Monitor which showed short runs of non-sustained VT with longest run being 7 seconds (patient was asymptomatic with this) and PVCs. No recurrent near syncope. Continue Toprol-XL.   Atypical Chest Pain  Patient complained of chest tightness when standing for long periods of time at last office visit. Myoview was low risk with no evidence of ischemia. She continues to describes sensation of "tiredness" mainly in her back when standing for long periods; however, no real chest pain. Does not sound cardiac in nature. No further evaluation at this time.  Hypertension BP well controlled. Continue Amlodipine 5mg  daily and Toprol-XL 50mg  daily. Also  on Lasix 20mg  daily as needed for swelling.   Hyperlipidemia - Lipid panel from 10/2018: Total Cholesterol 112, Triglycerides 91, HDL 29, LDL 65.  - Continue Lipitor 20mg  daily.  Disposition: Follow up in 6 months with Dr. .   Medication Adjustments/Labs and Tests Ordered: Current medicines are reviewed at length with the patient today.  Concerns regarding medicines are outlined above.  No orders of the defined types were placed in this encounter.  No orders of the defined types were placed in this encounter.   Patient Instructions  Medication Instructions:  The current medical regimen is effective;  continue present plan and medications as directed. Please refer to the Current Medication list given to you today. *If you need a refill on your cardiac medications before your next appointment, please call your pharmacy*  Follow-Up: Your next appointment:  6 month(s)  In Person with , MD  At [facility], you and your health needs are our priority.  As part of our continuing mission to provide you with exceptional heart care, we have created designated Provider Care Teams.  These Care Teams include your primary Cardiologist (physician) and Advanced Practice Providers (APPs -  Physician Assistants and Nurse Practitioners) who all work together to provide you with the care you need, when you need it.  We recommend signing up for the patient portal called "MyChart".  Sign up information is provided on this After Visit Summary.  MyChart is used to connect with patients for Virtual Visits (Telemedicine).  Patients are able to view lab/test results, encounter notes, upcoming appointments, etc.  Non-urgent messages can be sent to your provider as well.   To learn more about what you can do with MyChart, go to .        Signed, Duke Salvia, PA-C  07/12/2019 1:01 PM    [facility]

## 2019-07-12 ENCOUNTER — Ambulatory Visit (INDEPENDENT_AMBULATORY_CARE_PROVIDER_SITE_OTHER): Payer: Medicare Other | Admitting: Student

## 2019-07-12 ENCOUNTER — Other Ambulatory Visit: Payer: Self-pay

## 2019-07-12 ENCOUNTER — Encounter: Payer: Self-pay | Admitting: Student

## 2019-07-12 VITALS — BP 128/62 | HR 73 | Ht 62.0 in | Wt 156.8 lb

## 2019-07-12 DIAGNOSIS — I472 Ventricular tachycardia: Secondary | ICD-10-CM | POA: Diagnosis not present

## 2019-07-12 DIAGNOSIS — I10 Essential (primary) hypertension: Secondary | ICD-10-CM | POA: Diagnosis not present

## 2019-07-12 DIAGNOSIS — E785 Hyperlipidemia, unspecified: Secondary | ICD-10-CM | POA: Diagnosis not present

## 2019-07-12 DIAGNOSIS — R55 Syncope and collapse: Secondary | ICD-10-CM

## 2019-07-12 DIAGNOSIS — I4729 Other ventricular tachycardia: Secondary | ICD-10-CM

## 2019-07-12 DIAGNOSIS — R0789 Other chest pain: Secondary | ICD-10-CM

## 2019-07-12 NOTE — Patient Instructions (Signed)
Medication Instructions:  The current medical regimen is effective;  continue present plan and medications as directed. Please refer to the Current Medication list given to you today. *If you need a refill on your cardiac medications before your next appointment, please call your pharmacy*  Follow-Up: Your next appointment:  6 month(s)  In Person with Chilton Si, MD  At [facility], you and your health needs are our priority.  As part of our continuing mission to provide you with exceptional heart care, we have created designated Provider Care Teams.  These Care Teams include your primary Cardiologist (physician) and Advanced Practice Providers (APPs -  Physician Assistants and Nurse Practitioners) who all work together to provide you with the care you need, when you need it.  We recommend signing up for the patient portal called "MyChart".  Sign up information is provided on this After Visit Summary.  MyChart is used to connect with patients for Virtual Visits (Telemedicine).  Patients are able to view lab/test results, encounter notes, upcoming appointments, etc.  Non-urgent messages can be sent to your provider as well.   To learn more about what you can do with MyChart, go to [email or web address].

## 2019-08-24 DIAGNOSIS — J432 Centrilobular emphysema: Secondary | ICD-10-CM | POA: Diagnosis not present

## 2019-08-24 DIAGNOSIS — G2581 Restless legs syndrome: Secondary | ICD-10-CM | POA: Diagnosis not present

## 2019-08-24 DIAGNOSIS — G4762 Sleep related leg cramps: Secondary | ICD-10-CM | POA: Diagnosis not present

## 2019-08-24 DIAGNOSIS — D6489 Other specified anemias: Secondary | ICD-10-CM | POA: Diagnosis not present

## 2019-08-24 DIAGNOSIS — D649 Anemia, unspecified: Secondary | ICD-10-CM | POA: Diagnosis not present

## 2019-09-18 DIAGNOSIS — J301 Allergic rhinitis due to pollen: Secondary | ICD-10-CM | POA: Diagnosis not present

## 2019-09-18 DIAGNOSIS — G4733 Obstructive sleep apnea (adult) (pediatric): Secondary | ICD-10-CM | POA: Diagnosis not present

## 2019-09-18 DIAGNOSIS — J449 Chronic obstructive pulmonary disease, unspecified: Secondary | ICD-10-CM | POA: Diagnosis not present

## 2019-11-15 DIAGNOSIS — I5030 Unspecified diastolic (congestive) heart failure: Secondary | ICD-10-CM | POA: Diagnosis not present

## 2019-11-15 DIAGNOSIS — Z23 Encounter for immunization: Secondary | ICD-10-CM | POA: Diagnosis not present

## 2019-11-15 DIAGNOSIS — I110 Hypertensive heart disease with heart failure: Secondary | ICD-10-CM | POA: Diagnosis not present

## 2019-11-15 DIAGNOSIS — Z0000 Encounter for general adult medical examination without abnormal findings: Secondary | ICD-10-CM | POA: Diagnosis not present

## 2019-11-15 DIAGNOSIS — E1169 Type 2 diabetes mellitus with other specified complication: Secondary | ICD-10-CM | POA: Diagnosis not present

## 2019-11-15 DIAGNOSIS — E785 Hyperlipidemia, unspecified: Secondary | ICD-10-CM | POA: Diagnosis not present

## 2019-12-03 DIAGNOSIS — J449 Chronic obstructive pulmonary disease, unspecified: Secondary | ICD-10-CM | POA: Diagnosis not present

## 2019-12-03 DIAGNOSIS — G4733 Obstructive sleep apnea (adult) (pediatric): Secondary | ICD-10-CM | POA: Diagnosis not present

## 2019-12-03 DIAGNOSIS — J301 Allergic rhinitis due to pollen: Secondary | ICD-10-CM | POA: Diagnosis not present

## 2019-12-25 DIAGNOSIS — H401132 Primary open-angle glaucoma, bilateral, moderate stage: Secondary | ICD-10-CM | POA: Diagnosis not present

## 2020-01-11 ENCOUNTER — Ambulatory Visit: Payer: Medicare Other | Admitting: Cardiovascular Disease

## 2020-03-11 DIAGNOSIS — J4540 Moderate persistent asthma, uncomplicated: Secondary | ICD-10-CM | POA: Diagnosis not present

## 2020-03-11 DIAGNOSIS — G4733 Obstructive sleep apnea (adult) (pediatric): Secondary | ICD-10-CM | POA: Diagnosis not present

## 2020-03-11 DIAGNOSIS — J301 Allergic rhinitis due to pollen: Secondary | ICD-10-CM | POA: Diagnosis not present

## 2020-03-25 DIAGNOSIS — H401132 Primary open-angle glaucoma, bilateral, moderate stage: Secondary | ICD-10-CM | POA: Diagnosis not present

## 2020-03-26 ENCOUNTER — Ambulatory Visit (INDEPENDENT_AMBULATORY_CARE_PROVIDER_SITE_OTHER): Payer: Medicare Other | Admitting: Cardiovascular Disease

## 2020-03-26 ENCOUNTER — Other Ambulatory Visit: Payer: Self-pay

## 2020-03-26 VITALS — BP 130/66 | HR 77 | Ht 61.5 in | Wt 163.0 lb

## 2020-03-26 DIAGNOSIS — R55 Syncope and collapse: Secondary | ICD-10-CM | POA: Diagnosis not present

## 2020-03-26 DIAGNOSIS — J9611 Chronic respiratory failure with hypoxia: Secondary | ICD-10-CM

## 2020-03-26 DIAGNOSIS — I10 Essential (primary) hypertension: Secondary | ICD-10-CM | POA: Diagnosis not present

## 2020-03-26 DIAGNOSIS — E785 Hyperlipidemia, unspecified: Secondary | ICD-10-CM

## 2020-03-26 NOTE — Patient Instructions (Signed)
Medication Instructions:  Your physician recommends that you continue on your current medications as directed. Please refer to the Current Medication list given to you today.  *If you need a refill on your cardiac medications before your next appointment, please call your pharmacy*  Lab Work: CMET/CBC/MAGNESIUM/TSH SOON   If you have labs (blood work) drawn today and your tests are completely normal, you will receive your results only by: Marland Kitchen MyChart Message (if you have MyChart) OR . A paper copy in the mail If you have any lab test that is abnormal or we need to change your treatment, we will call you to review the results.  Testing/Procedures: NONE   Follow-Up: At [facility], you and your health needs are our priority.  As part of our continuing mission to provide you with exceptional heart care, we have created designated Provider Care Teams.  These Care Teams include your primary Cardiologist (physician) and Advanced Practice Providers (APPs -  Physician Assistants and Nurse Practitioners) who all work together to provide you with the care you need, when you need it.  We recommend signing up for the patient portal called "MyChart".  Sign up information is provided on this After Visit Summary.  MyChart is used to connect with patients for Virtual Visits (Telemedicine).  Patients are able to view lab/test results, encounter notes, upcoming appointments, etc.  Non-urgent messages can be sent to your provider as well.   To learn more about what you can do with MyChart, go to [email or web address].    Your next appointment:   6 month(s)  The format for your next appointment:   In Person  Provider:   You may see Chilton Si, MD or one of the following Advanced Practice Providers on your designated Care Team:    [place], PA-C  Edd Fabian, FNP

## 2020-03-26 NOTE — Progress Notes (Signed)
Cardiology Office Note   Date:  04/17/2020   ID:  Kieley, Akter 1930-11-23, MRN [id number]  PCP:  Street, [name], MD  Cardiologist:   Chilton Si, MD   No chief complaint on file.    History of Present Illness: Heather Aguilar is a 87 y.o. female with OSA, hypertension, diabetes, GERD, hyperlipidemia, CKD III, and COPD here for follow up.  She was seen in 2021 for the evaluation of near syncope.  Heather Aguilar had an episode of near syncope that occurred while sitting at the table.  Ten minutes prior to the episode she was in the restroom straining with constipation.  she was sitting at the table when She started feeling hot and like she was going to pass out. There was no associated chest pain or palpitations but she did feel short of breath.  The episode lasted for several minutes so she decided to go the ED.  By the time she reached the ED her lightheadedness improved but she still remained short of breath.  She was seen in the ED on 4/12 where orthostatic vitals were negative.  Echo revealed LVEF 65-70% with grade 1 diastolic dysfunction.  While there her heart rate was in the 30s so verapamil was discontinued. Amlodipine was added to her regimen.  She reported tinnitus and had a carotid Doppler that revealed mild stenosis bilaterally.  The left thryoud showed internal vascularity and heterogeneous areas of uncertain significance.  She reports knowing that she has a thyroid nodule and this has been followed as an outpatient.  An ambulatory monitor was placed.  She wore it for a few days but had difficulty with skin irritation from the stickers.  She has not had any recurrent episodes of near syncope.  Heather Aguilar denies any orthostatic lightheadedness.  She typically drinks 2 waters and 4 decaffeinated coffees daily.  She does not get much exercise.  She notes that when she stands for more than 10 or 15 minutes she starts feeling like there is a weight around her chest and shoulders.   This is associated with an increase in her shortness of breath.  She denies any chest pain or pressure.  She uses a CPAP and oxygen at night.  She denies lower extremity edema.  She wears compression socks regularly.  Overall since her last appointment she has felt well.  She had an episode yesterday when sitting at the table where she started to feel near syncopal.  She felt as if she might pass out.  She decided not to get up and it passed within a few minutes.  There were no associated palpitations.  She was not using her oxygen at the time.  She had no chest pain or shortness of breath.  In general her breathing has been stable.  She denies any lower extremity edema, orthopnea, or PND.  She is not getting much exercise and thinks that this is why she has gained weight.  She continues to use her CPAP.   Past Medical History:  Diagnosis Date  . COPD (chronic obstructive pulmonary disease) (HCC)    on home O2  . Diabetes mellitus (HCC)   . Dyslipidemia   . Essential hypertension   . OSA (obstructive sleep apnea)    wears CPAP  . Pulmonary nodule   . Thyroid goiter     Past Surgical History:  Procedure Laterality Date  . ABDOMINAL HYSTERECTOMY    . COLON SURGERY  Current Outpatient Medications  Medication Sig Dispense Refill  . acetaminophen (TYLENOL) 500 MG tablet Take 500-1,000 mg by mouth every 6 (six) hours as needed for mild pain or headache.    . ALPHAGAN P 0.15 % ophthalmic solution Place 1 drop into both eyes daily.    Marland Kitchen ALPRAZolam (XANAX) 0.25 MG tablet Take 0.25 mg by mouth daily as needed for anxiety.    Marland Kitchen amLODipine (NORVASC) 5 MG tablet Take 1 tablet (5 mg total) by mouth daily. 30 tablet 0  . ANORO ELLIPTA 62.5-25 MCG/INH AEPB Inhale 1 puff into the lungs daily.    Marland Kitchen atorvastatin (LIPITOR) 20 MG tablet Take 20 mg by mouth daily.    . ferrous sulfate 325 (65 FE) MG tablet Take 325 mg by mouth daily with lunch.    . furosemide (LASIX) 20 MG tablet Take 20 mg by  mouth daily as needed (for swelling of the ankles).     Marland Kitchen ipratropium-albuterol (DUONEB) 0.5-2.5 (3) MG/3ML SOLN Take 3 mLs by nebulization 2 (two) times daily.    . lansoprazole (PREVACID) 30 MG capsule Take 30 mg by mouth daily before breakfast.    . levalbuterol (XOPENEX HFA) 45 MCG/ACT inhaler Inhale 2 puffs into the lungs every 6 (six) hours as needed for shortness of breath.    . meloxicam (MOBIC) 15 MG tablet Take 15 mg by mouth daily as needed for pain.    . metFORMIN (GLUCOPHAGE) 500 MG tablet Take 500 mg by mouth 2 (two) times daily.    . metoprolol succinate (TOPROL-XL) 50 MG 24 hr tablet Take 50 mg by mouth daily.    . OXYGEN Inhale 2 L/min into the lungs continuous.    Marland Kitchen PRESCRIPTION MEDICATION CPAP- At bedtime and during any times of rest (WITH OXYGEN)    . TRAVATAN Z 0.004 % SOLN ophthalmic solution Place 1 drop into both eyes in the morning and at bedtime.     No current facility-administered medications for this visit.    Allergies:   Demerol [meperidine] and Talwin [pentazocine]    Social History:  The patient  reports that she quit smoking about 42 years ago. She has a 30.00 pack-year smoking history. She has never used smokeless tobacco. She reports previous alcohol use. She reports that she does not use drugs.   Family History:  The patient's family history includes Breast cancer in her sister; Diabetes in her brother, maternal aunt, and sister; Heart attack in her father and mother; Lung cancer in her brother; Stroke in her daughter and sister.    ROS:  Please see the history of present illness.   Otherwise, review of systems are positive for none.   All other systems are reviewed and negative.    PHYSICAL EXAM: VS:  BP 130/66   Pulse 77   Ht 5' 1.5" (1.562 m)   Wt 163 lb (73.9 kg)   LMP  (LMP Unknown)   SpO2 96%   BMI 30.30 kg/m  , BMI Body mass index is 30.3 kg/m. GENERAL:  Well appearing HEENT:  Pupils equal round and reactive, fundi not visualized, oral  mucosa unremarkable NECK:  No jugular venous distention, waveform within normal limits, carotid upstroke brisk and symmetric, no bruits, +thyromegaly LUNGS:  Diminished breath sounds.  Clear to auscultation bilaterally HEART:  RRR.  PMI not displaced or sustained,S1 and S2 within normal limits, no S3, no S4, no clicks, no rubs, no murmurs ABD:  Flat, positive bowel sounds normal in frequency in pitch, no  bruits, no rebound, no guarding, no midline pulsatile mass, no hepatomegaly, no splenomegaly EXT:  2 plus pulses throughout, no LE edema, no cyanosis no clubbing SKIN:  No rashes no nodules NEURO:  Cranial nerves II through XII grossly intact, motor grossly intact throughout PSYCH:  Cognitively intact, oriented to person place and time  EKG:  EKG is ordered today. The ekg ordered 05/05/19 demonstrates Sinus rhythm.  Rate 85 bpm 03/26/2020: Sinus rhythm.  Rate 77 bpm.  30 Day Event Monitor 04/2019:  Quality: Fair.  Baseline artifact. Predominant rhythm: sinus rhythm Average heart rate: 80 bpm Max heart rate: 133 bpm Min heart rate: 50 bpm Pauses >2.5 seconds: none  Multiple short runs of VT (up to 7 seconds).  Most episodes a rate around 170 bpm.  One episode 245 bpm (10 beats). Occasional PVCs.  Lexiscan Myoview5/2021:  There was no ST segment deviation noted during stress.  No T wave inversion was noted during stress.  This is a low risk study. There are no significant perfusion defects seen at stress and rest. There appears to be bowel loop attenuation in the inferoseptal region.  Recent Labs: 05/05/2019: B Natriuretic Peptide 108.1 03/31/2020: ALT 6; BUN 13; Creatinine, Ser 0.79; Hemoglobin 11.9; Magnesium 1.8; Platelets 219; Potassium 4.2; Sodium 141; TSH 0.083    Lipid Panel No results found for: CHOL, TRIG, HDL, CHOLHDL, VLDL, LDLCALC, LDLDIRECT    Wt Readings from Last 3 Encounters:  03/26/20 163 lb (73.9 kg)  07/12/19 156 lb 12.8 oz (71.1 kg)  05/18/19 157 lb 3.2 oz  (71.3 kg)      ASSESSMENT AND PLAN:  # Near syncope:  # NSVT: # Bradycardia:  Episodes of occurred in the setting of not using her supplemental oxygen.  There was also question of bradycardia contributing as well.  She wore an ambulatory monitor that did not suggest any significant bradycardia.  She did have NSVT but no evidence of ischemia on stress testing 05/2019.  Overall she seems to be doing well now.  She understands the importance of using her supplemental oxygen.  She will come back for comprehensive metabolic panel, CBC, magnesium, and TSH.  Bradycardia resolved after stopping verapamil.  # Chest tightness:   Resolved.  Lexiscan Myoview - 05/2019.  # Essential hypertension: Blood pressure stable on her current regimen of amlodipine, metoprolol, and furosemide.  # Hyperlipidemia:  Continue statin.  Current medicines are reviewed at length with the patient today.  The patient does not have concerns regarding medicines.  The following changes have been made:  no change  Labs/ tests ordered today include:  Orders Placed This Encounter  Procedures  . CBC with Differential/Platelet  . TSH  . Comprehensive metabolic panel  . Magnesium  . EKG 12-Lead     Disposition:   FU with Riad Wagley C. Duke Salvia, MD, [facility] in 6-8 weeks.    Signed, Adonay Scheier C. Duke Salvia, MD, [facility]  04/17/2020 1:14 PM    [facility]

## 2020-03-31 DIAGNOSIS — R55 Syncope and collapse: Secondary | ICD-10-CM | POA: Diagnosis not present

## 2020-03-31 DIAGNOSIS — I10 Essential (primary) hypertension: Secondary | ICD-10-CM | POA: Diagnosis not present

## 2020-03-31 LAB — CBC WITH DIFFERENTIAL/PLATELET
Basophils Absolute: 0.1 10*3/uL (ref 0.0–0.2)
Basos: 1 %
EOS (ABSOLUTE): 0.2 10*3/uL (ref 0.0–0.4)
Eos: 2 %
Hematocrit: 37.4 % (ref 34.0–46.6)
Hemoglobin: 11.9 g/dL (ref 11.1–15.9)
Immature Grans (Abs): 0 10*3/uL (ref 0.0–0.1)
Immature Granulocytes: 0 %
Lymphocytes Absolute: 1.5 10*3/uL (ref 0.7–3.1)
Lymphs: 15 %
MCH: 25.2 pg — ABNORMAL LOW (ref 26.6–33.0)
MCHC: 31.8 g/dL (ref 31.5–35.7)
MCV: 79 fL (ref 79–97)
Monocytes Absolute: 0.6 10*3/uL (ref 0.1–0.9)
Monocytes: 7 %
Neutrophils Absolute: 7.4 10*3/uL — ABNORMAL HIGH (ref 1.4–7.0)
Neutrophils: 75 %
Platelets: 219 10*3/uL (ref 150–450)
RBC: 4.73 x10E6/uL (ref 3.77–5.28)
RDW: 15.9 % — ABNORMAL HIGH (ref 11.7–15.4)
WBC: 9.7 10*3/uL (ref 3.4–10.8)

## 2020-03-31 LAB — COMPREHENSIVE METABOLIC PANEL
ALT: 6 IU/L (ref 0–32)
AST: 12 IU/L (ref 0–40)
Albumin/Globulin Ratio: 1.3 (ref 1.2–2.2)
Albumin: 4 g/dL (ref 3.6–4.6)
Alkaline Phosphatase: 92 IU/L (ref 44–121)
BUN/Creatinine Ratio: 16 (ref 12–28)
BUN: 13 mg/dL (ref 8–27)
Bilirubin Total: 0.4 mg/dL (ref 0.0–1.2)
CO2: 25 mmol/L (ref 20–29)
Calcium: 9 mg/dL (ref 8.7–10.3)
Chloride: 104 mmol/L (ref 96–106)
Creatinine, Ser: 0.79 mg/dL (ref 0.57–1.00)
Globulin, Total: 3 g/dL (ref 1.5–4.5)
Glucose: 149 mg/dL — ABNORMAL HIGH (ref 65–99)
Potassium: 4.2 mmol/L (ref 3.5–5.2)
Sodium: 141 mmol/L (ref 134–144)
Total Protein: 7 g/dL (ref 6.0–8.5)
eGFR: 72 mL/min/{1.73_m2} (ref >59)

## 2020-03-31 LAB — MAGNESIUM: Magnesium: 1.8 mg/dL (ref 1.6–2.3)

## 2020-03-31 LAB — TSH: TSH: 0.083 u[IU]/mL — ABNORMAL LOW (ref 0.450–4.500)

## 2020-04-17 ENCOUNTER — Encounter: Payer: Self-pay | Admitting: Cardiovascular Disease

## 2020-05-12 DIAGNOSIS — L2089 Other atopic dermatitis: Secondary | ICD-10-CM | POA: Diagnosis not present

## 2020-06-10 DIAGNOSIS — J301 Allergic rhinitis due to pollen: Secondary | ICD-10-CM | POA: Diagnosis not present

## 2020-06-10 DIAGNOSIS — J4540 Moderate persistent asthma, uncomplicated: Secondary | ICD-10-CM | POA: Diagnosis not present

## 2020-06-10 DIAGNOSIS — G4733 Obstructive sleep apnea (adult) (pediatric): Secondary | ICD-10-CM | POA: Diagnosis not present

## 2020-07-03 DIAGNOSIS — E785 Hyperlipidemia, unspecified: Secondary | ICD-10-CM | POA: Diagnosis not present

## 2020-07-03 DIAGNOSIS — I5030 Unspecified diastolic (congestive) heart failure: Secondary | ICD-10-CM | POA: Diagnosis not present

## 2020-07-03 DIAGNOSIS — E1169 Type 2 diabetes mellitus with other specified complication: Secondary | ICD-10-CM | POA: Diagnosis not present

## 2020-07-03 DIAGNOSIS — I110 Hypertensive heart disease with heart failure: Secondary | ICD-10-CM | POA: Diagnosis not present

## 2020-07-22 DIAGNOSIS — H401132 Primary open-angle glaucoma, bilateral, moderate stage: Secondary | ICD-10-CM | POA: Diagnosis not present

## 2020-09-22 DIAGNOSIS — J4540 Moderate persistent asthma, uncomplicated: Secondary | ICD-10-CM | POA: Diagnosis not present

## 2020-09-22 DIAGNOSIS — J301 Allergic rhinitis due to pollen: Secondary | ICD-10-CM | POA: Diagnosis not present

## 2020-09-22 DIAGNOSIS — G4733 Obstructive sleep apnea (adult) (pediatric): Secondary | ICD-10-CM | POA: Diagnosis not present

## 2020-10-02 DIAGNOSIS — J432 Centrilobular emphysema: Secondary | ICD-10-CM | POA: Diagnosis not present

## 2020-10-02 DIAGNOSIS — K8050 Calculus of bile duct without cholangitis or cholecystitis without obstruction: Secondary | ICD-10-CM | POA: Diagnosis not present

## 2020-10-02 DIAGNOSIS — B372 Candidiasis of skin and nail: Secondary | ICD-10-CM | POA: Diagnosis not present

## 2020-10-02 DIAGNOSIS — R1084 Generalized abdominal pain: Secondary | ICD-10-CM | POA: Diagnosis not present

## 2020-10-10 DIAGNOSIS — N281 Cyst of kidney, acquired: Secondary | ICD-10-CM | POA: Diagnosis not present

## 2020-10-10 DIAGNOSIS — R1084 Generalized abdominal pain: Secondary | ICD-10-CM | POA: Diagnosis not present

## 2020-10-10 DIAGNOSIS — K8050 Calculus of bile duct without cholangitis or cholecystitis without obstruction: Secondary | ICD-10-CM | POA: Diagnosis not present

## 2020-10-10 DIAGNOSIS — K7689 Other specified diseases of liver: Secondary | ICD-10-CM | POA: Diagnosis not present

## 2020-11-17 DIAGNOSIS — I5032 Chronic diastolic (congestive) heart failure: Secondary | ICD-10-CM | POA: Diagnosis not present

## 2020-11-17 DIAGNOSIS — Z79899 Other long term (current) drug therapy: Secondary | ICD-10-CM | POA: Diagnosis not present

## 2020-11-17 DIAGNOSIS — E785 Hyperlipidemia, unspecified: Secondary | ICD-10-CM | POA: Diagnosis not present

## 2020-11-17 DIAGNOSIS — J432 Centrilobular emphysema: Secondary | ICD-10-CM | POA: Diagnosis not present

## 2020-11-17 DIAGNOSIS — E1169 Type 2 diabetes mellitus with other specified complication: Secondary | ICD-10-CM | POA: Diagnosis not present

## 2020-11-17 DIAGNOSIS — Z23 Encounter for immunization: Secondary | ICD-10-CM | POA: Diagnosis not present

## 2020-11-17 DIAGNOSIS — Z0000 Encounter for general adult medical examination without abnormal findings: Secondary | ICD-10-CM | POA: Diagnosis not present

## 2020-11-17 DIAGNOSIS — E0590 Thyrotoxicosis, unspecified without thyrotoxic crisis or storm: Secondary | ICD-10-CM | POA: Diagnosis not present

## 2020-12-29 DIAGNOSIS — J4540 Moderate persistent asthma, uncomplicated: Secondary | ICD-10-CM | POA: Diagnosis not present

## 2020-12-29 DIAGNOSIS — G4733 Obstructive sleep apnea (adult) (pediatric): Secondary | ICD-10-CM | POA: Diagnosis not present

## 2020-12-29 DIAGNOSIS — J301 Allergic rhinitis due to pollen: Secondary | ICD-10-CM | POA: Diagnosis not present

## 2021-02-17 DIAGNOSIS — H401132 Primary open-angle glaucoma, bilateral, moderate stage: Secondary | ICD-10-CM | POA: Diagnosis not present

## 2021-02-24 DIAGNOSIS — I10 Essential (primary) hypertension: Secondary | ICD-10-CM | POA: Diagnosis not present

## 2021-02-24 DIAGNOSIS — E782 Mixed hyperlipidemia: Secondary | ICD-10-CM | POA: Diagnosis not present

## 2021-04-13 DIAGNOSIS — J301 Allergic rhinitis due to pollen: Secondary | ICD-10-CM | POA: Diagnosis not present

## 2021-04-13 DIAGNOSIS — J4540 Moderate persistent asthma, uncomplicated: Secondary | ICD-10-CM | POA: Diagnosis not present

## 2021-04-13 DIAGNOSIS — G4733 Obstructive sleep apnea (adult) (pediatric): Secondary | ICD-10-CM | POA: Diagnosis not present

## 2021-07-14 DIAGNOSIS — J4540 Moderate persistent asthma, uncomplicated: Secondary | ICD-10-CM | POA: Diagnosis not present

## 2021-07-14 DIAGNOSIS — G4733 Obstructive sleep apnea (adult) (pediatric): Secondary | ICD-10-CM | POA: Diagnosis not present

## 2021-07-14 DIAGNOSIS — J301 Allergic rhinitis due to pollen: Secondary | ICD-10-CM | POA: Diagnosis not present

## 2021-09-15 DIAGNOSIS — J4540 Moderate persistent asthma, uncomplicated: Secondary | ICD-10-CM | POA: Diagnosis not present

## 2021-09-15 DIAGNOSIS — G4733 Obstructive sleep apnea (adult) (pediatric): Secondary | ICD-10-CM | POA: Diagnosis not present

## 2021-09-15 DIAGNOSIS — J301 Allergic rhinitis due to pollen: Secondary | ICD-10-CM | POA: Diagnosis not present

## 2021-10-14 DIAGNOSIS — Z79899 Other long term (current) drug therapy: Secondary | ICD-10-CM | POA: Diagnosis not present

## 2021-10-14 DIAGNOSIS — E1169 Type 2 diabetes mellitus with other specified complication: Secondary | ICD-10-CM | POA: Diagnosis not present

## 2021-10-14 DIAGNOSIS — E785 Hyperlipidemia, unspecified: Secondary | ICD-10-CM | POA: Diagnosis not present

## 2021-10-14 DIAGNOSIS — E0590 Thyrotoxicosis, unspecified without thyrotoxic crisis or storm: Secondary | ICD-10-CM | POA: Diagnosis not present

## 2021-10-14 DIAGNOSIS — J9611 Chronic respiratory failure with hypoxia: Secondary | ICD-10-CM | POA: Diagnosis not present

## 2021-10-14 DIAGNOSIS — J432 Centrilobular emphysema: Secondary | ICD-10-CM | POA: Diagnosis not present

## 2021-12-08 DIAGNOSIS — J301 Allergic rhinitis due to pollen: Secondary | ICD-10-CM | POA: Diagnosis not present

## 2021-12-08 DIAGNOSIS — J4540 Moderate persistent asthma, uncomplicated: Secondary | ICD-10-CM | POA: Diagnosis not present

## 2021-12-08 DIAGNOSIS — G4733 Obstructive sleep apnea (adult) (pediatric): Secondary | ICD-10-CM | POA: Diagnosis not present

## 2021-12-09 DIAGNOSIS — Z0000 Encounter for general adult medical examination without abnormal findings: Secondary | ICD-10-CM | POA: Diagnosis not present

## 2022-01-01 DIAGNOSIS — I10 Essential (primary) hypertension: Secondary | ICD-10-CM | POA: Diagnosis not present

## 2022-01-08 DIAGNOSIS — J4540 Moderate persistent asthma, uncomplicated: Secondary | ICD-10-CM | POA: Diagnosis not present

## 2022-01-08 DIAGNOSIS — J301 Allergic rhinitis due to pollen: Secondary | ICD-10-CM | POA: Diagnosis not present

## 2022-01-08 DIAGNOSIS — G4733 Obstructive sleep apnea (adult) (pediatric): Secondary | ICD-10-CM | POA: Diagnosis not present

## 2022-01-10 IMAGING — CT CT ANGIO CHEST
2 of 7 series · 18 of 46 positions shown · IV contrast (omnipaque)
Comparison: None.

CLINICAL DATA: Increased short of breath.

EXAM:
CT ANGIOGRAPHY CHEST WITH CONTRAST
TECHNIQUE: Multidetector CT imaging of the chest was performed using the
standard protocol during bolus administration of intravenous
contrast. Multiplanar CT image reconstructions and MIPs were
obtained to evaluate the vascular anatomy.
CONTRAST:  100mL OMNIPAQUE IOHEXOL 350 MG/ML SOLN

[Series 7: thins · axial · 0.64mm/px · z∈[-34,+203]mm · 15 of 383 slices shown]
[im 22/383  lung]
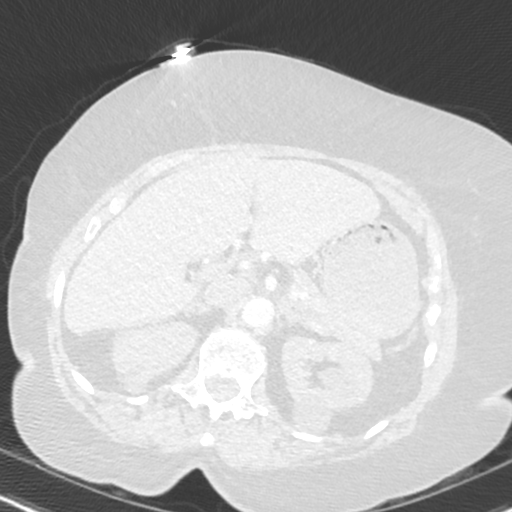
[im 43/383  soft-tissue]
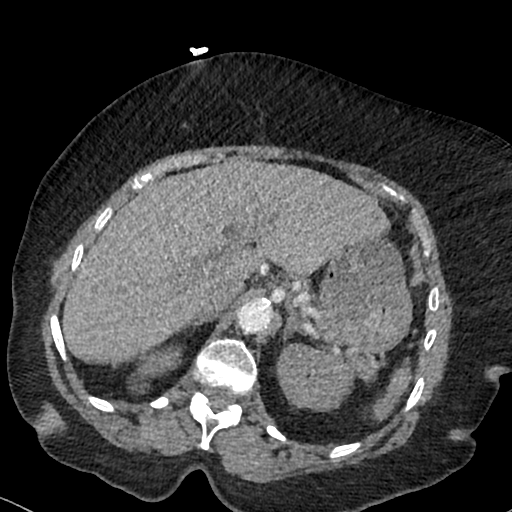
[im 64/383  lung]
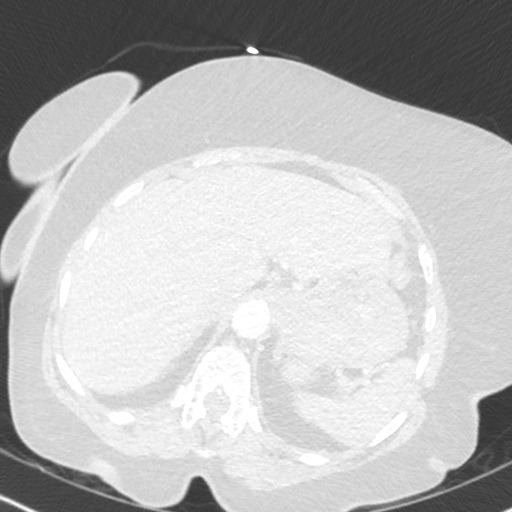
[im 85/383  soft-tissue]
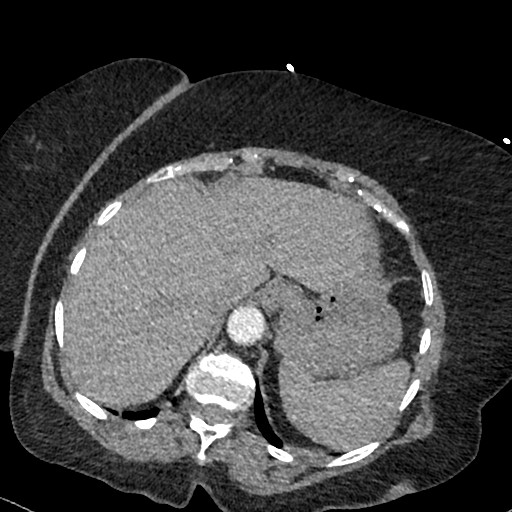
[im 128/383  lung]
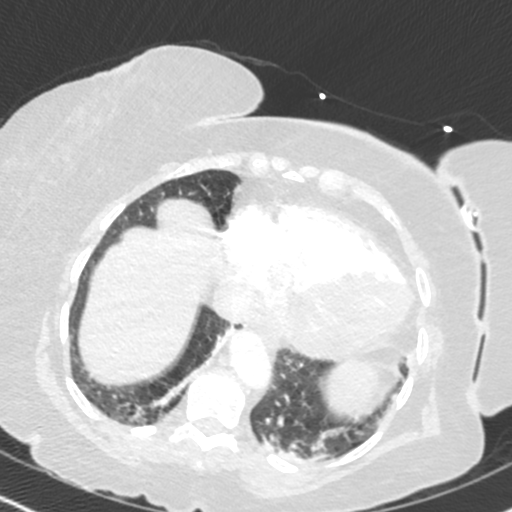
[im 149/383  soft-tissue]
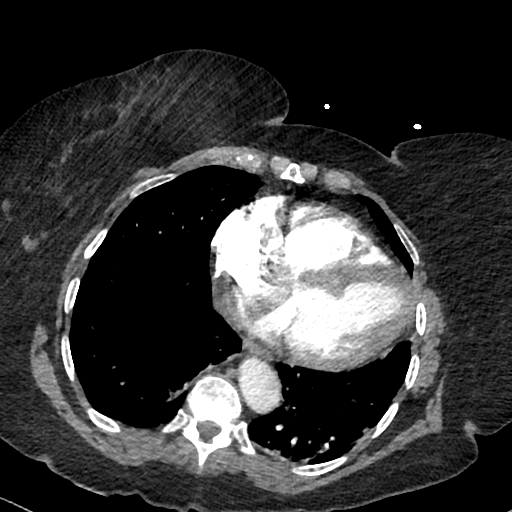
[im 170/383  lung]
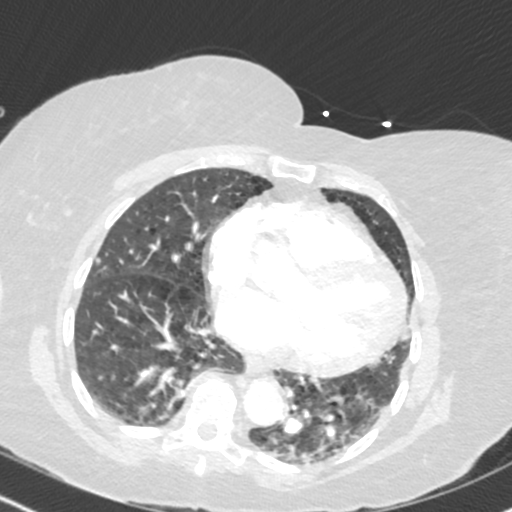
[im 192/383  soft-tissue]
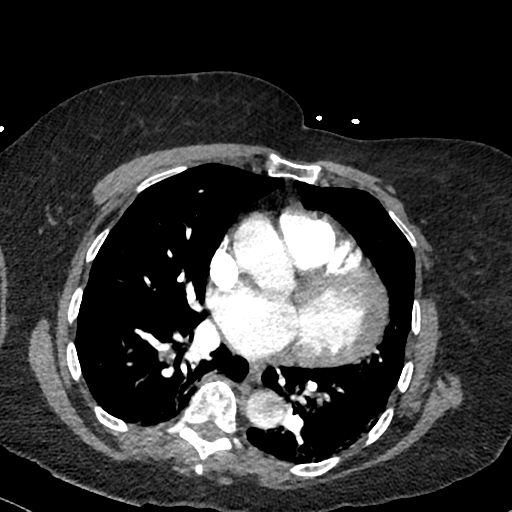
[im 213/383  lung]
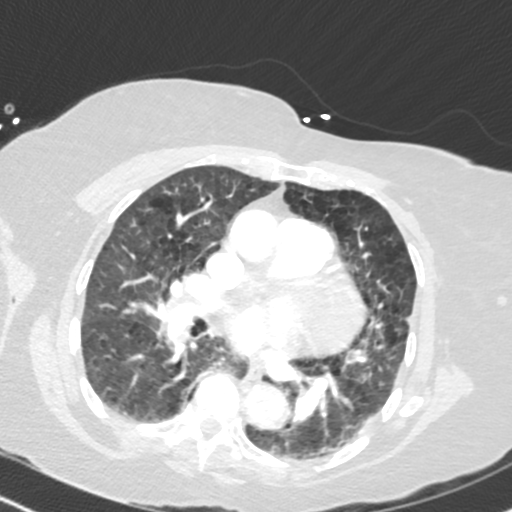
[im 234/383  soft-tissue]
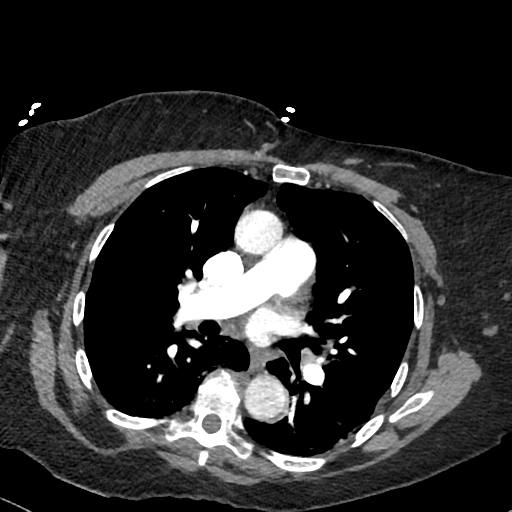
[im 255/383  lung]
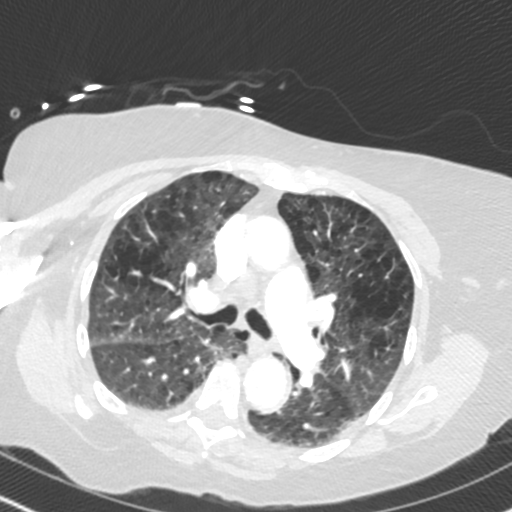
[im 298/383  soft-tissue]
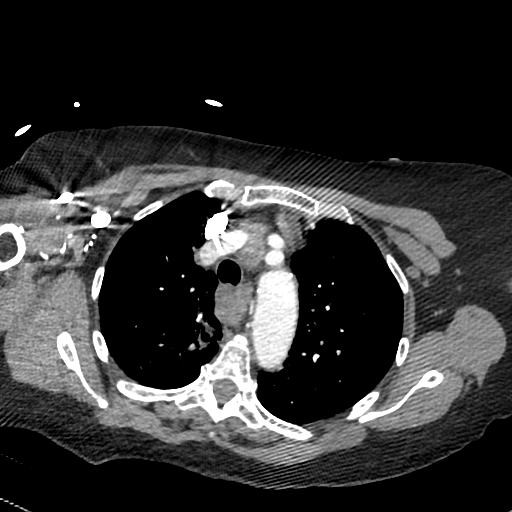
[im 319/383  lung]
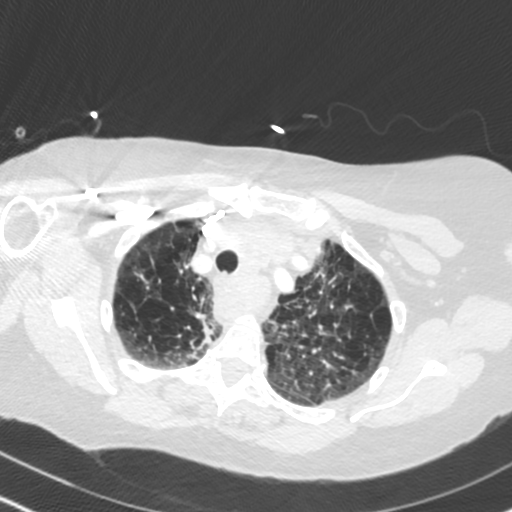
[im 340/383  soft-tissue]
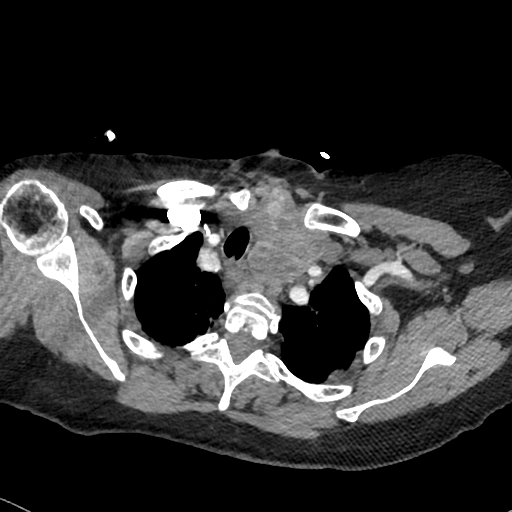
[im 361/383  lung]
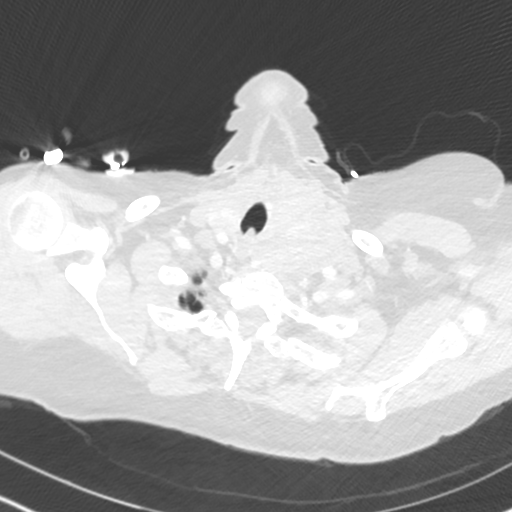

[Series 8: cor · coronal · 0.54mm/px · 3 of 162 slices shown]
[im 41/162  soft-tissue]
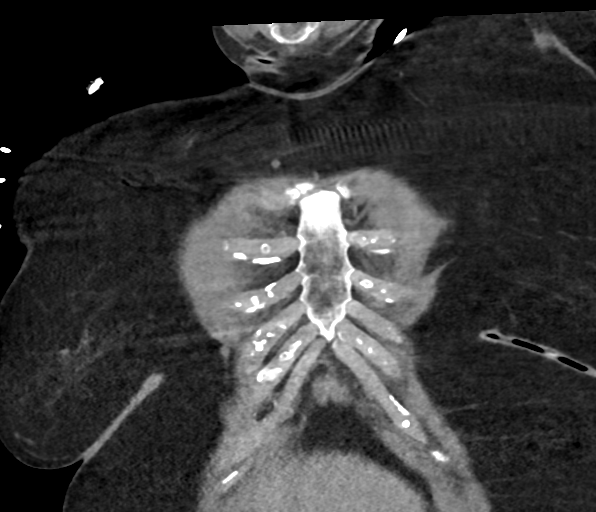
[im 81/162  soft-tissue]
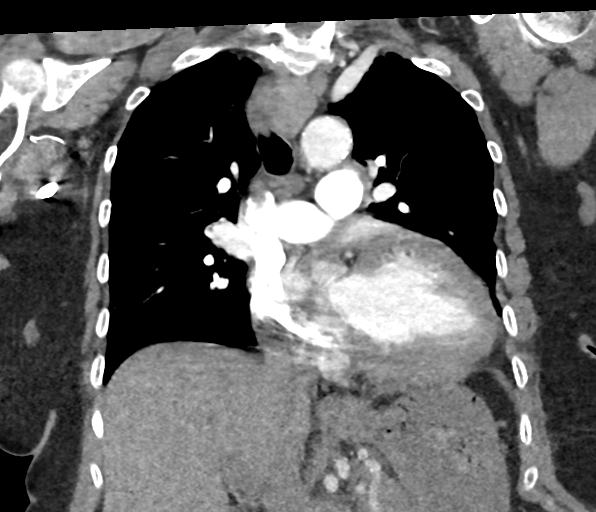
[im 121/162  soft-tissue]
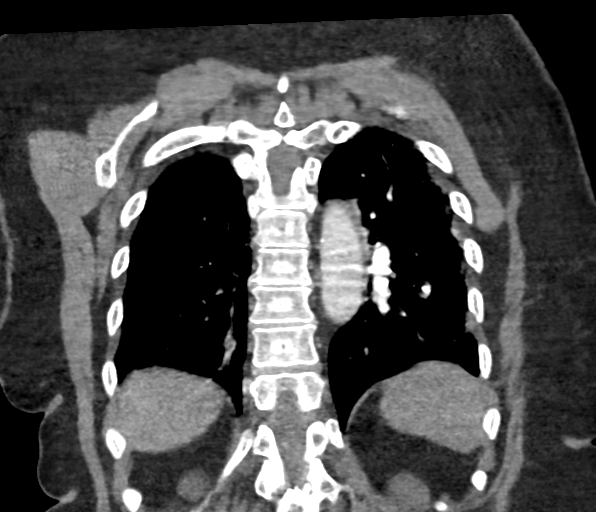

[18 of 46 positions shown; findings below may reference images not displayed]

FINDINGS: Cardiovascular: No filling defects within the pulmonary arteries to
suggest acute pulmonary embolism.

Mediastinum/Nodes: No axillary adenopathy. Bulky LEFT
supraclavicular mass measuring 4.1 cm (image [DATE]) extends into the
thoracic inlet. This appears to originate from the thyroid gland.
). Nodular component extends posterior to the trachea above the
carina measuring 2.2 cm. exam. Findings similar to CT 12/06/2012

Lungs/Pleura: Extensive centrilobular emphysema the upper lobes. No
suspicious nodularity. Airspace disease. No pulmonary infarction.
Small focus consolidation LEFT lower lobe measuring 2.3 cm is
increased from 1.1 cm on remote comparison CT. Limited view of the
liver, kidneys, pancreas are unremarkable. Normal adrenal glands.

Upper Abdomen: Limited view of the liver, kidneys, pancreas are
unremarkable. Normal adrenal glands.

Musculoskeletal: No aggressive osseous lesion.

Review of the MIP images confirms the above findings.
IMPRESSION: 1. No evidence acute pulmonary embolism.
2. Extensive centrilobular emphysema.
3. Small focus consolidation in the LEFT lower lobe is increased in
size from CT 6322. Favor benign finding. Consider follow-up chest CT
in 3 to 6 months.
4. Again demonstrated large the thyroid goiter with substernal and
paratracheal extension.

## 2022-01-10 IMAGING — CR DG CHEST 2V
2 series · 2 of 2 positions shown · non-contrast
Comparison: March 26, 2017

CLINICAL DATA: Shortness of breath.

EXAM:
CHEST - 2 VIEW

[chest lat]
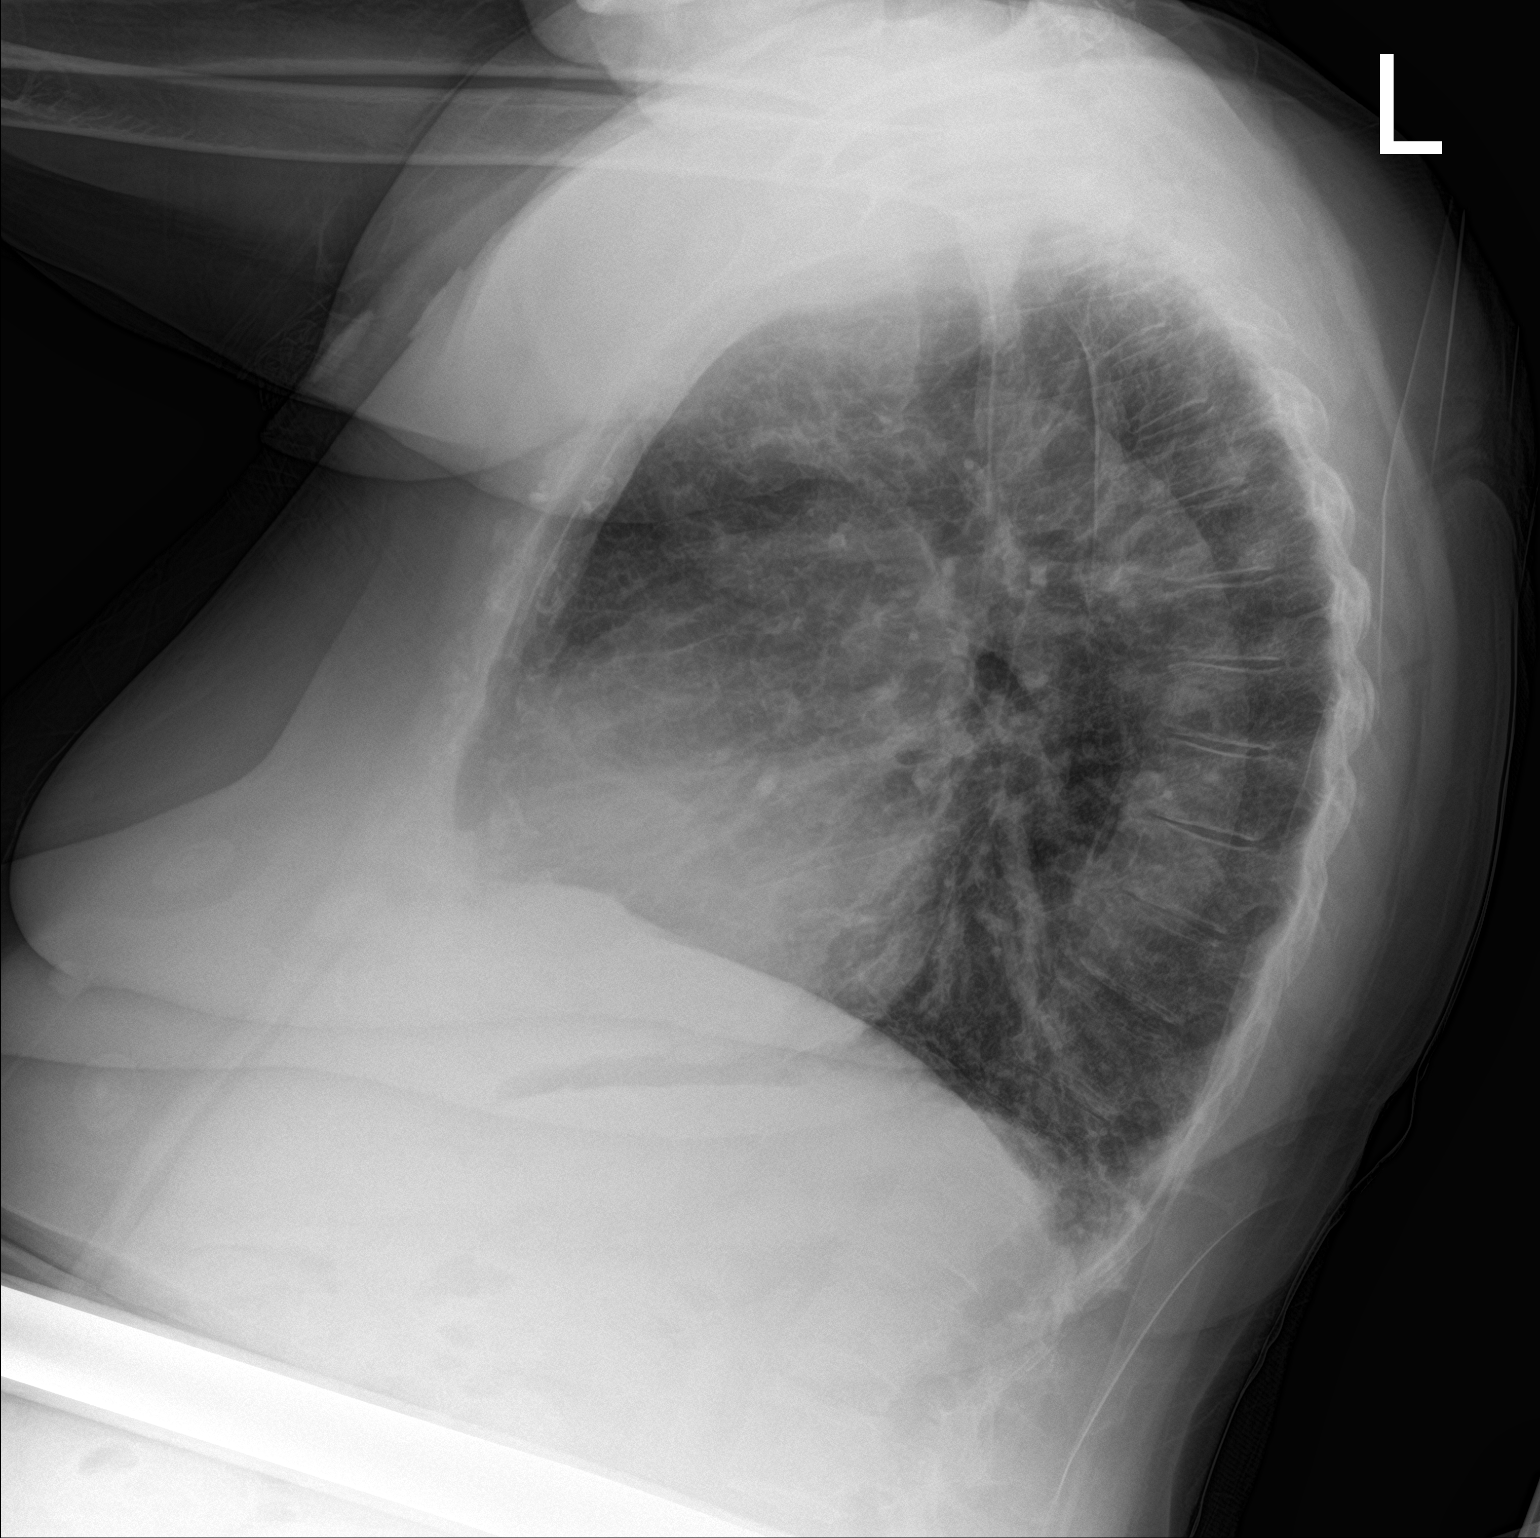

[chest ap]
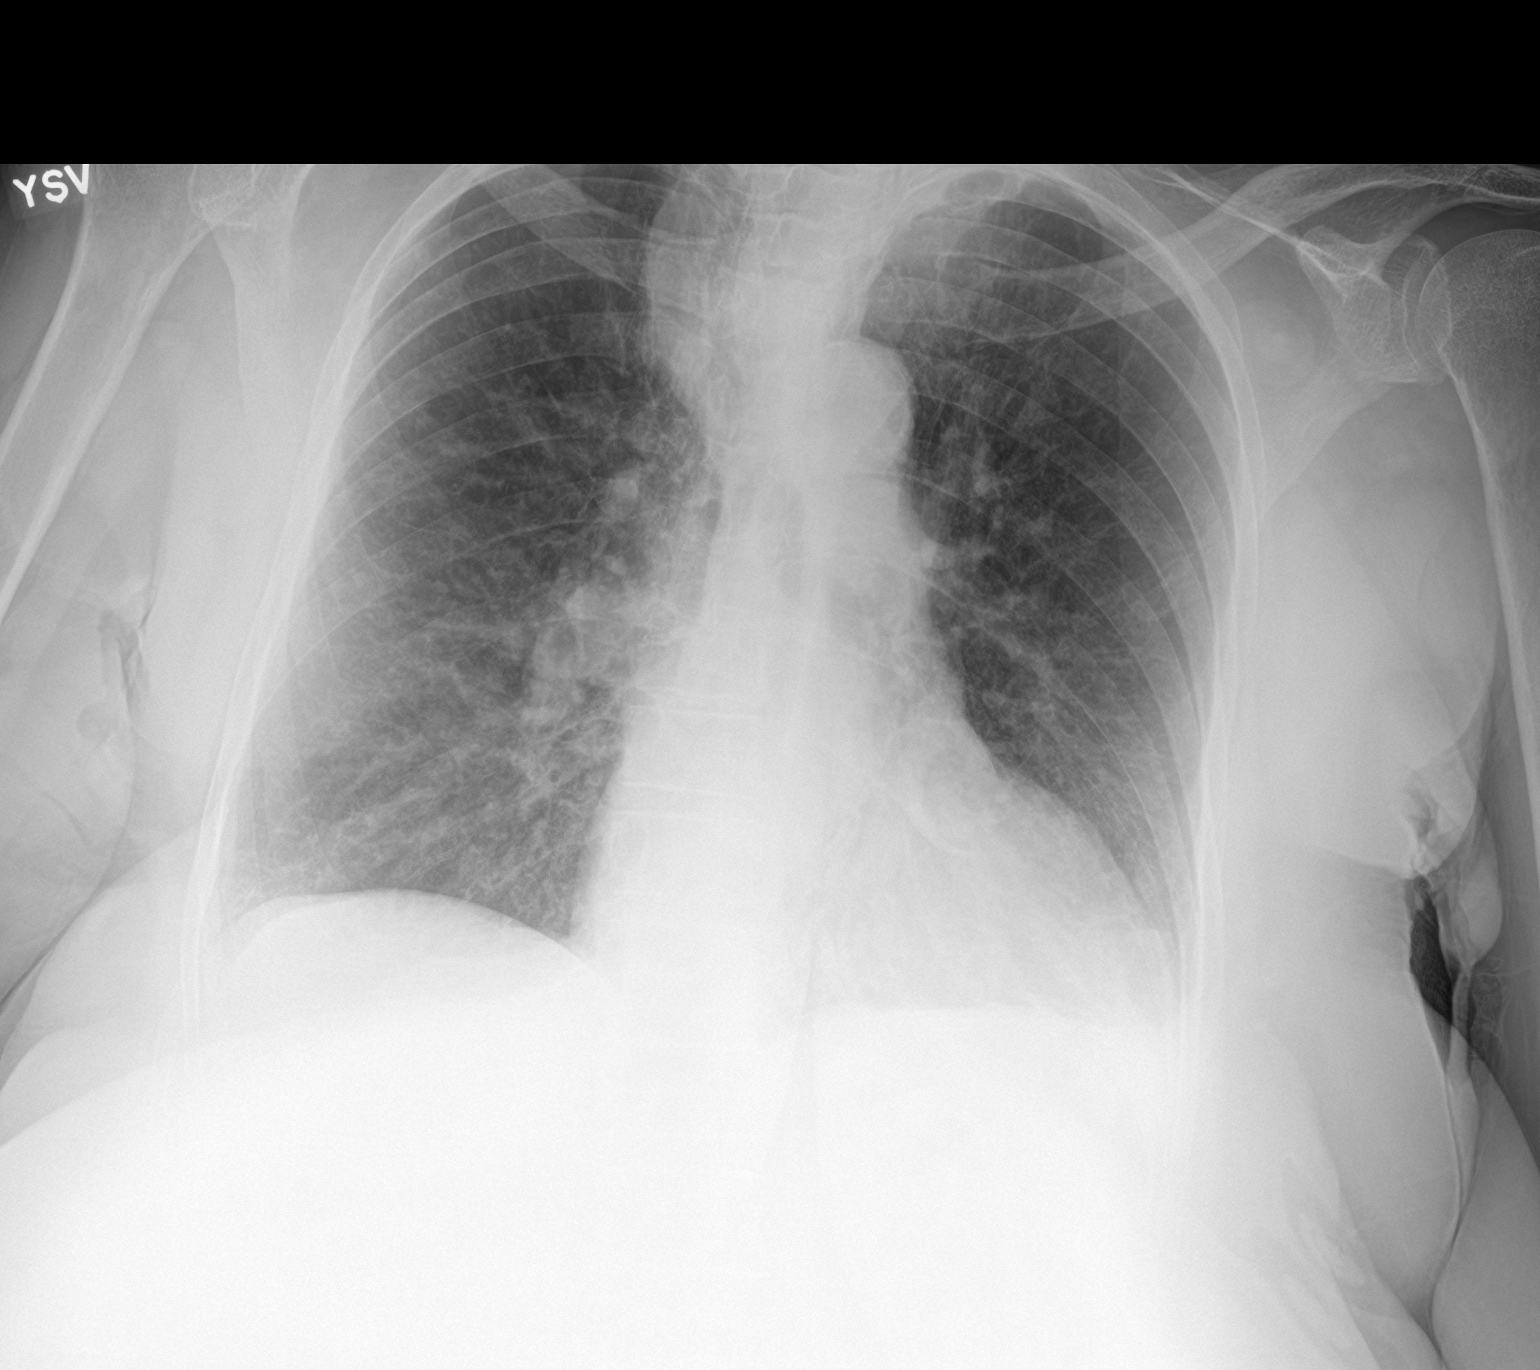

[2 of 2 positions shown; findings below may reference images not displayed]

FINDINGS: Mild to moderate severity diffuse chronic appearing increased
interstitial lung markings are seen. There is no evidence of acute
infiltrate, pleural effusion or pneumothorax. The heart size and
mediastinal contours are within normal limits. The visualized
skeletal structures are unremarkable.
IMPRESSION: Chronic-appearing increased interstitial lung markings without
evidence of acute or active cardiopulmonary disease.

## 2022-05-07 DIAGNOSIS — J301 Allergic rhinitis due to pollen: Secondary | ICD-10-CM | POA: Diagnosis not present

## 2022-05-07 DIAGNOSIS — J449 Chronic obstructive pulmonary disease, unspecified: Secondary | ICD-10-CM | POA: Diagnosis not present

## 2022-05-07 DIAGNOSIS — G4733 Obstructive sleep apnea (adult) (pediatric): Secondary | ICD-10-CM | POA: Diagnosis not present

## 2022-08-11 DIAGNOSIS — J301 Allergic rhinitis due to pollen: Secondary | ICD-10-CM | POA: Diagnosis not present

## 2022-08-11 DIAGNOSIS — J449 Chronic obstructive pulmonary disease, unspecified: Secondary | ICD-10-CM | POA: Diagnosis not present

## 2022-08-11 DIAGNOSIS — G4733 Obstructive sleep apnea (adult) (pediatric): Secondary | ICD-10-CM | POA: Diagnosis not present

## 2023-01-11 DIAGNOSIS — J449 Chronic obstructive pulmonary disease, unspecified: Secondary | ICD-10-CM | POA: Diagnosis not present

## 2023-01-11 DIAGNOSIS — G4733 Obstructive sleep apnea (adult) (pediatric): Secondary | ICD-10-CM | POA: Diagnosis not present

## 2023-01-11 DIAGNOSIS — J301 Allergic rhinitis due to pollen: Secondary | ICD-10-CM | POA: Diagnosis not present

## 2023-02-22 DIAGNOSIS — I5030 Unspecified diastolic (congestive) heart failure: Secondary | ICD-10-CM | POA: Diagnosis not present

## 2023-02-22 DIAGNOSIS — Z79899 Other long term (current) drug therapy: Secondary | ICD-10-CM | POA: Diagnosis not present

## 2023-02-22 DIAGNOSIS — Z0000 Encounter for general adult medical examination without abnormal findings: Secondary | ICD-10-CM | POA: Diagnosis not present

## 2023-02-22 DIAGNOSIS — E1169 Type 2 diabetes mellitus with other specified complication: Secondary | ICD-10-CM | POA: Diagnosis not present

## 2023-02-22 DIAGNOSIS — D6489 Other specified anemias: Secondary | ICD-10-CM | POA: Diagnosis not present

## 2023-02-22 DIAGNOSIS — E785 Hyperlipidemia, unspecified: Secondary | ICD-10-CM | POA: Diagnosis not present

## 2023-02-22 DIAGNOSIS — E0590 Thyrotoxicosis, unspecified without thyrotoxic crisis or storm: Secondary | ICD-10-CM | POA: Diagnosis not present

## 2023-02-22 DIAGNOSIS — I110 Hypertensive heart disease with heart failure: Secondary | ICD-10-CM | POA: Diagnosis not present

## 2023-04-19 DIAGNOSIS — R918 Other nonspecific abnormal finding of lung field: Secondary | ICD-10-CM | POA: Diagnosis not present

## 2023-04-19 DIAGNOSIS — G4733 Obstructive sleep apnea (adult) (pediatric): Secondary | ICD-10-CM | POA: Diagnosis not present

## 2023-04-19 DIAGNOSIS — Z6827 Body mass index (BMI) 27.0-27.9, adult: Secondary | ICD-10-CM | POA: Diagnosis not present

## 2023-04-19 DIAGNOSIS — J449 Chronic obstructive pulmonary disease, unspecified: Secondary | ICD-10-CM | POA: Diagnosis not present

## 2023-11-23 DIAGNOSIS — E538 Deficiency of other specified B group vitamins: Secondary | ICD-10-CM | POA: Diagnosis not present

## 2023-11-23 DIAGNOSIS — R42 Dizziness and giddiness: Secondary | ICD-10-CM | POA: Diagnosis not present

## 2023-11-23 DIAGNOSIS — Z6824 Body mass index (BMI) 24.0-24.9, adult: Secondary | ICD-10-CM | POA: Diagnosis not present
